# Patient Record
Sex: Female | Born: 1985 | Race: Black or African American | Hispanic: No | Marital: Married | Smoking: Never smoker
Health system: Southern US, Community
[De-identification: ages and names within clinical notes are randomized; demographics above are authoritative.]

## PROBLEM LIST (undated history)

## (undated) ENCOUNTER — Inpatient Hospital Stay (HOSPITAL_COMMUNITY): Payer: Self-pay

## (undated) DIAGNOSIS — Z862 Personal history of diseases of the blood and blood-forming organs and certain disorders involving the immune mechanism: Secondary | ICD-10-CM

## (undated) DIAGNOSIS — T4145XA Adverse effect of unspecified anesthetic, initial encounter: Secondary | ICD-10-CM

## (undated) DIAGNOSIS — T8859XA Other complications of anesthesia, initial encounter: Secondary | ICD-10-CM

## (undated) DIAGNOSIS — R51 Headache: Principal | ICD-10-CM

## (undated) DIAGNOSIS — E559 Vitamin D deficiency, unspecified: Secondary | ICD-10-CM

## (undated) DIAGNOSIS — R519 Headache, unspecified: Secondary | ICD-10-CM

## (undated) DIAGNOSIS — Z789 Other specified health status: Secondary | ICD-10-CM

## (undated) HISTORY — PX: NO PAST SURGERIES: SHX2092

## (undated) HISTORY — DX: Vitamin D deficiency, unspecified: E55.9

## (undated) HISTORY — DX: Personal history of diseases of the blood and blood-forming organs and certain disorders involving the immune mechanism: Z86.2

## (undated) HISTORY — DX: Headache, unspecified: R51.9

## (undated) HISTORY — DX: Headache: R51

---

## 2006-11-11 ENCOUNTER — Emergency Department (HOSPITAL_COMMUNITY): Admission: EM | Admit: 2006-11-11 | Discharge: 2006-11-11 | Payer: Self-pay | Admitting: Emergency Medicine

## 2007-07-05 ENCOUNTER — Inpatient Hospital Stay (HOSPITAL_COMMUNITY): Admission: AD | Admit: 2007-07-05 | Discharge: 2007-07-05 | Payer: Self-pay | Admitting: Obstetrics and Gynecology

## 2007-07-05 ENCOUNTER — Inpatient Hospital Stay (HOSPITAL_COMMUNITY): Admission: AD | Admit: 2007-07-05 | Discharge: 2007-07-08 | Payer: Self-pay | Admitting: Obstetrics & Gynecology

## 2007-07-20 ENCOUNTER — Encounter: Admission: RE | Admit: 2007-07-20 | Discharge: 2007-08-19 | Payer: Self-pay | Admitting: Obstetrics & Gynecology

## 2007-08-20 ENCOUNTER — Encounter: Admission: RE | Admit: 2007-08-20 | Discharge: 2007-09-18 | Payer: Self-pay | Admitting: Obstetrics & Gynecology

## 2007-09-19 ENCOUNTER — Encounter: Admission: RE | Admit: 2007-09-19 | Discharge: 2007-10-19 | Payer: Self-pay | Admitting: Obstetrics & Gynecology

## 2007-10-20 ENCOUNTER — Encounter: Admission: RE | Admit: 2007-10-20 | Discharge: 2007-11-18 | Payer: Self-pay | Admitting: Obstetrics & Gynecology

## 2007-11-19 ENCOUNTER — Encounter: Admission: RE | Admit: 2007-11-19 | Discharge: 2007-12-10 | Payer: Self-pay | Admitting: Obstetrics & Gynecology

## 2011-06-02 LAB — GC/CHLAMYDIA PROBE AMP, GENITAL: Chlamydia: NEGATIVE

## 2011-06-02 LAB — HEPATITIS B SURFACE ANTIGEN: Hepatitis B Surface Ag: NEGATIVE

## 2011-06-02 LAB — HIV ANTIBODY (ROUTINE TESTING W REFLEX): HIV: NONREACTIVE

## 2011-06-02 LAB — ABO/RH: RH Type: POSITIVE

## 2011-06-06 ENCOUNTER — Inpatient Hospital Stay (HOSPITAL_COMMUNITY)
Admission: AD | Admit: 2011-06-06 | Discharge: 2011-06-06 | Disposition: A | Discharge status: Home / Self Care | Payer: Medicaid Other | Source: Ambulatory Visit | Attending: Obstetrics and Gynecology | Admitting: Obstetrics and Gynecology

## 2011-06-06 ENCOUNTER — Encounter (HOSPITAL_COMMUNITY): Payer: Self-pay

## 2011-06-06 DIAGNOSIS — O2390 Unspecified genitourinary tract infection in pregnancy, unspecified trimester: Secondary | ICD-10-CM | POA: Insufficient documentation

## 2011-06-06 DIAGNOSIS — O210 Mild hyperemesis gravidarum: Secondary | ICD-10-CM

## 2011-06-06 DIAGNOSIS — N390 Urinary tract infection, site not specified: Secondary | ICD-10-CM

## 2011-06-06 DIAGNOSIS — O2340 Unspecified infection of urinary tract in pregnancy, unspecified trimester: Secondary | ICD-10-CM

## 2011-06-06 HISTORY — DX: Other specified health status: Z78.9

## 2011-06-06 LAB — COMPREHENSIVE METABOLIC PANEL
AST: 19 U/L (ref 0–37)
Albumin: 3.7 g/dL (ref 3.5–5.2)
Alkaline Phosphatase: 47 U/L (ref 39–117)
BUN: 9 mg/dL (ref 6–23)
CO2: 19 mEq/L (ref 19–32)
Chloride: 100 mEq/L (ref 96–112)
Creatinine, Ser: <0.47 mg/dL — ABNORMAL LOW (ref 0.50–1.10)
Potassium: 4.1 mEq/L (ref 3.5–5.1)
Total Bilirubin: 0.5 mg/dL (ref 0.3–1.2)

## 2011-06-06 LAB — URINALYSIS, ROUTINE W REFLEX MICROSCOPIC
Bilirubin Urine: NEGATIVE
Ketones, ur: 15 mg/dL — AB
Nitrite: NEGATIVE
Protein, ur: NEGATIVE mg/dL
Specific Gravity, Urine: 1.02 (ref 1.005–1.030)
Urobilinogen, UA: 1 mg/dL (ref 0.0–1.0)

## 2011-06-06 LAB — CBC
Hemoglobin: 13 g/dL (ref 12.0–15.0)
MCH: 31.1 pg (ref 26.0–34.0)
MCHC: 35.7 g/dL (ref 30.0–36.0)
MCV: 87.1 fL (ref 78.0–100.0)
RBC: 4.18 MIL/uL (ref 3.87–5.11)

## 2011-06-06 LAB — URINE MICROSCOPIC-ADD ON

## 2011-06-06 MED ORDER — ONDANSETRON 8 MG PO TBDP: 8.0000 mg | ORAL_TABLET | Freq: Three times a day (TID) | ORAL | Status: AC | PRN

## 2011-06-06 MED ORDER — ONDANSETRON HCL 4 MG/2ML IJ SOLN
4.0000 mg | Freq: Once | INTRAMUSCULAR | Status: AC
  Administered 2011-06-06: 4 mg via INTRAVENOUS
  Filled 2011-06-06: qty 2

## 2011-06-06 MED ORDER — NITROFURANTOIN MONOHYD MACRO 100 MG PO CAPS: 100.0000 mg | ORAL_CAPSULE | Freq: Two times a day (BID) | ORAL | Status: AC

## 2011-06-06 MED ORDER — PROMETHAZINE HCL 25 MG/ML IJ SOLN
25.0000 mg | Freq: Once | INTRAVENOUS | Status: AC
  Administered 2011-06-06: 25 mg via INTRAVENOUS
  Filled 2011-06-06: qty 1

## 2011-06-06 NOTE — Progress Notes (Signed)
Patient reports having N/V ever since found out pregnant taking something for nausea however not helping, abdominal discomfort, weakness

## 2011-06-06 NOTE — ED Provider Notes (Signed)
History     Chief Complaint  Patient presents with  . Emesis During Pregnancy  . Fatigue  . Abdominal Pain   HPI Has been seen in the office this pregnancy and has phenergan tablets but "they do not work".  Has not had any Phenergan today.  Has not been able to keep down food or fluids today.  OB History    Grav Para Term Preterm Abortions TAB SAB Ect Mult Living   2 1 1       1       Past Medical History  Diagnosis Date  . No pertinent past medical history     Past Surgical History  Procedure Date  . No past surgeries     No family history on file.  History  Substance Use Topics  . Smoking status: Never Smoker   . Smokeless tobacco: Not on file  . Alcohol Use: No    Allergies: No Known Allergies  Prescriptions prior to admission  Medication Sig Dispense Refill  . ketoconazole (NIZORAL) 200 MG tablet Take 200 mg by mouth daily.        . prenatal vitamin w/FE, FA (PRENATAL 1 + 1) 27-1 MG TABS Take 1 tablet by mouth daily. Patient no longer takes the Prenatal vitamin.  It makes her really sick to her stomach.       . promethazine (PHENERGAN) 25 MG tablet Take 25 mg by mouth every 6 (six) hours as needed.          ROS Physical Exam   Blood pressure 111/60, pulse 56, temperature 98.3 F (36.8 C), temperature source Oral, resp. rate 18, height 5\' 4"  (1.626 m), weight 192 lb 12.8 oz (87.454 kg), last menstrual period 04/06/2011.  Physical Exam  Nursing note and vitals reviewed. Constitutional: She is oriented to person, place, and time. She appears well-developed and well-nourished.  HENT:  Head: Normocephalic.  Eyes: EOM are normal.  Neck: Neck supple.  Musculoskeletal: Normal range of motion.  Neurological: She is alert and oriented to person, place, and time.  Skin: Skin is warm and dry.    MAU Course  Procedures  MDM  Results for orders placed during the hospital encounter of 06/06/11 (from the past 24 hour(s))  URINALYSIS, ROUTINE W REFLEX  MICROSCOPIC     Status: Abnormal   Collection Time   06/06/11  4:10 PM      Component Value Range   Color, Urine YELLOW  YELLOW    Appearance CLOUDY (*) CLEAR    Specific Gravity, Urine 1.020  1.005 - 1.030    pH 8.5 (*) 5.0 - 8.0    Glucose, UA NEGATIVE  NEGATIVE (mg/dL)   Hgb urine dipstick NEGATIVE  NEGATIVE    Bilirubin Urine NEGATIVE  NEGATIVE    Ketones, ur 15 (*) NEGATIVE (mg/dL)   Protein, ur NEGATIVE  NEGATIVE (mg/dL)   Urobilinogen, UA 1.0  0.0 - 1.0 (mg/dL)   Nitrite NEGATIVE  NEGATIVE    Leukocytes, UA LARGE (*) NEGATIVE   URINE MICROSCOPIC-ADD ON     Status: Abnormal   Collection Time   06/06/11  4:10 PM      Component Value Range   Squamous Epithelial / LPF MANY (*) RARE    WBC, UA 21-50  <3 (WBC/hpf)   RBC / HPF 0-2  <3 (RBC/hpf)   Bacteria, UA MANY (*) RARE   POCT PREGNANCY, URINE     Status: Normal   Collection Time   06/06/11  4:14 PM  Component Value Range   Preg Test, Ur POSITIVE     Consult with Dr. Henderson Cloud.  Will give IVF to help control vomiting and will treat for UTI.  Assessment: Hyperemesis Urinary tract infection in pregnancy   Nolene Bernheim, NP 06/06/11 2010

## 2011-09-03 LAB — CBC
MCV: 93.1
Platelets: 151
WBC: 9.8

## 2011-09-06 LAB — CBC: Hemoglobin: 12

## 2011-09-06 LAB — RPR: RPR Ser Ql: NONREACTIVE

## 2011-10-26 ENCOUNTER — Inpatient Hospital Stay (HOSPITAL_COMMUNITY)
Admission: AD | Admit: 2011-10-26 | Discharge: 2011-10-27 | Disposition: A | Discharge status: Home / Self Care | Payer: Medicaid Other | Source: Ambulatory Visit | Attending: Obstetrics & Gynecology | Admitting: Obstetrics & Gynecology

## 2011-10-26 ENCOUNTER — Encounter (HOSPITAL_COMMUNITY): Payer: Self-pay | Admitting: *Deleted

## 2011-10-26 DIAGNOSIS — M62830 Muscle spasm of back: Secondary | ICD-10-CM

## 2011-10-26 DIAGNOSIS — N949 Unspecified condition associated with female genital organs and menstrual cycle: Secondary | ICD-10-CM

## 2011-10-26 DIAGNOSIS — M545 Low back pain, unspecified: Secondary | ICD-10-CM | POA: Insufficient documentation

## 2011-10-26 DIAGNOSIS — O99891 Other specified diseases and conditions complicating pregnancy: Secondary | ICD-10-CM | POA: Insufficient documentation

## 2011-10-26 DIAGNOSIS — M5380 Other specified dorsopathies, site unspecified: Secondary | ICD-10-CM | POA: Insufficient documentation

## 2011-10-26 LAB — URINALYSIS, ROUTINE W REFLEX MICROSCOPIC
Glucose, UA: 250 mg/dL — AB
Hgb urine dipstick: NEGATIVE
Ketones, ur: NEGATIVE mg/dL
Protein, ur: NEGATIVE mg/dL
pH: 7 (ref 5.0–8.0)

## 2011-10-26 MED ORDER — OXYCODONE-ACETAMINOPHEN 5-325 MG PO TABS
1.0000 | ORAL_TABLET | Freq: Once | ORAL | Status: AC
  Administered 2011-10-26: 1 via ORAL
  Filled 2011-10-26: qty 1

## 2011-10-26 MED ORDER — CYCLOBENZAPRINE HCL 10 MG PO TABS
10.0000 mg | ORAL_TABLET | Freq: Once | ORAL | Status: AC
  Administered 2011-10-26: 10 mg via ORAL
  Filled 2011-10-26: qty 1

## 2011-10-26 NOTE — Progress Notes (Signed)
Pt states she started having pain about 1730 when she was eating

## 2011-10-26 NOTE — ED Provider Notes (Signed)
History     Chief Complaint  Patient presents with  . Dysmenorrhea   HPI 25 y.o. G2P1001 at [redacted]w[redacted]d. Pain starting around 5:30 tonight, pain primarily in low back, also having pain in suprapubic area, about q 10 minutes, sharp and quick pain, lasts a few seconds. Worse with fetal movement. No bleeding or discharge, + fetal movement.     Past Medical History  Diagnosis Date  . No pertinent past medical history     Past Surgical History  Procedure Date  . No past surgeries     History reviewed. No pertinent family history.  History  Substance Use Topics  . Smoking status: Never Smoker   . Smokeless tobacco: Not on file  . Alcohol Use: No    Allergies: No Known Allergies  Prescriptions prior to admission  Medication Sig Dispense Refill  . prenatal vitamin w/FE, FA (PRENATAL 1 + 1) 27-1 MG TABS Take 1 tablet by mouth daily.         Review of Systems  Constitutional: Negative.   HENT: Positive for neck pain.   Respiratory: Negative.   Cardiovascular: Negative.   Gastrointestinal: Positive for abdominal pain. Negative for nausea, vomiting, diarrhea and constipation.  Genitourinary: Negative for dysuria, urgency, frequency, hematuria and flank pain.       Negative for vaginal bleeding, cramping/contractions  Neurological: Negative.   Psychiatric/Behavioral: Negative.    Physical Exam   Blood pressure 114/65, pulse 88, temperature 98.1 F (36.7 C), temperature source Oral, resp. rate 20, height 5\' 6"  (1.676 m), weight 98.884 kg (218 lb), last menstrual period 04/06/2011, SpO2 99.00%.  Physical Exam  Nursing note and vitals reviewed. Constitutional: She is oriented to person, place, and time. She appears well-developed and well-nourished. She appears distressed.  Cardiovascular: Normal rate.   Respiratory: Effort normal.  GI: Soft. She exhibits no distension and no mass. There is no tenderness. There is no rebound and no guarding.  Genitourinary:       Spec exam:  cervix visually closed and long  Musculoskeletal: Normal range of motion. She exhibits tenderness (lumbar).  Neurological: She is alert and oriented to person, place, and time.  Skin: Skin is warm and dry.  Psychiatric: She has a normal mood and affect.   EFM: 140, mod variability, + accels, no decels, no contractions MAU Course  Procedures  Results for orders placed during the hospital encounter of 10/26/11 (from the past 24 hour(s))  URINALYSIS, ROUTINE W REFLEX MICROSCOPIC     Status: Abnormal   Collection Time   10/26/11  9:50 PM      Component Value Range   Color, Urine YELLOW  YELLOW    APPearance CLEAR  CLEAR    Specific Gravity, Urine 1.010  1.005 - 1.030    pH 7.0  5.0 - 8.0    Glucose, UA 250 (*) NEGATIVE (mg/dL)   Hgb urine dipstick NEGATIVE  NEGATIVE    Bilirubin Urine NEGATIVE  NEGATIVE    Ketones, ur NEGATIVE  NEGATIVE (mg/dL)   Protein, ur NEGATIVE  NEGATIVE (mg/dL)   Urobilinogen, UA 0.2  0.0 - 1.0 (mg/dL)   Nitrite NEGATIVE  NEGATIVE    Leukocytes, UA TRACE (*) NEGATIVE   URINE MICROSCOPIC-ADD ON     Status: Normal   Collection Time   10/26/11  9:50 PM      Component Value Range   Squamous Epithelial / LPF RARE  RARE    WBC, UA 0-2  <3 (WBC/hpf)   Bacteria, UA RARE  RARE    Pain seems c/w muscle spasm - Percocet and Flexeril given, pain improved  Assessment and Plan  25 y.o. G2P1001 at [redacted]w[redacted]d Muscle spasm - rx flexeril Precautions rev'd, f/u as scheduled  Roberta Galvan,Roberta Galvan 10/26/2011, 10:17 PM

## 2011-10-26 NOTE — Progress Notes (Signed)
Pt states she went shopping today-after she got home;around 1730-she started cramping-states it has gotten worse and worse

## 2011-10-27 MED ORDER — CYCLOBENZAPRINE HCL 10 MG PO TABS: 10.0000 mg | ORAL_TABLET | Freq: Three times a day (TID) | ORAL | Status: AC | PRN

## 2011-11-23 NOTE — L&D Delivery Note (Signed)
Good progression with a NSD of viable 7 pound 7.8 oz Female, Apgars 8/9 over intact perineum.  PDI.  Appeared to have a short cord.  2A/1V.  No Tears.  Breast and bottle feed.

## 2011-12-17 LAB — STREP B DNA PROBE: GBS: POSITIVE

## 2011-12-30 ENCOUNTER — Inpatient Hospital Stay (HOSPITAL_COMMUNITY)
Admission: AD | Admit: 2011-12-30 | Discharge: 2012-01-02 | DRG: 775 | Disposition: A | Discharge status: Home / Self Care | Payer: Medicaid Other | Source: Ambulatory Visit | Attending: Obstetrics and Gynecology | Admitting: Obstetrics and Gynecology

## 2011-12-30 DIAGNOSIS — Z22330 Carrier of Group B streptococcus: Secondary | ICD-10-CM

## 2011-12-30 DIAGNOSIS — Z3480 Encounter for supervision of other normal pregnancy, unspecified trimester: Secondary | ICD-10-CM

## 2011-12-30 DIAGNOSIS — O99892 Other specified diseases and conditions complicating childbirth: Principal | ICD-10-CM | POA: Diagnosis present

## 2011-12-30 NOTE — Progress Notes (Signed)
Pt presents for contractions, seen in MD's office this am and was 2 cm. Denies problems with pregnancy. Reports good fetal movement

## 2011-12-31 ENCOUNTER — Encounter (HOSPITAL_COMMUNITY): Payer: Self-pay | Admitting: Anesthesiology

## 2011-12-31 ENCOUNTER — Inpatient Hospital Stay (HOSPITAL_COMMUNITY): Payer: Medicaid Other | Admitting: Anesthesiology

## 2011-12-31 LAB — CBC
HCT: 33 % — ABNORMAL LOW (ref 36.0–46.0)
HCT: 33.2 % — ABNORMAL LOW (ref 36.0–46.0)
Hemoglobin: 11 g/dL — ABNORMAL LOW (ref 12.0–15.0)
Hemoglobin: 10.7 g/dL — ABNORMAL LOW (ref 12.0–15.0)
MCH: 30 pg (ref 26.0–34.0)
MCH: 29.7 pg (ref 26.0–34.0)
MCHC: 33.3 g/dL (ref 30.0–36.0)
MCHC: 32.5 g/dL (ref 30.0–36.0)
MCV: 93 fL (ref 78.0–100.0)
MCV: 91.5 fL (ref 78.0–100.0)
Platelets: 114 10*3/uL — ABNORMAL LOW (ref 150–400)
RDW: 14.4 % (ref 11.5–15.5)
RDW: 14.2 % (ref 11.5–15.5)
RDW: 14.2 % (ref 11.5–15.5)
WBC: 6.3 10*3/uL (ref 4.0–10.5)

## 2011-12-31 MED ORDER — ZOLPIDEM TARTRATE 5 MG PO TABS: 5.0000 mg | ORAL_TABLET | Freq: Every evening | ORAL | Status: DC | PRN

## 2011-12-31 MED ORDER — ONDANSETRON HCL 4 MG PO TABS: 4.0000 mg | ORAL_TABLET | ORAL | Status: DC | PRN

## 2011-12-31 MED ORDER — LIDOCAINE HCL 1.5 % IJ SOLN
INTRAMUSCULAR | Status: DC | PRN
  Administered 2011-12-31 (×2): 5 mL via EPIDURAL

## 2011-12-31 MED ORDER — LACTATED RINGERS IV SOLN
INTRAVENOUS | Status: DC
  Administered 2011-12-31 (×2): via INTRAVENOUS

## 2011-12-31 MED ORDER — PENICILLIN G POTASSIUM 5000000 UNITS IJ SOLR
5.0000 10*6.[IU] | Freq: Once | INTRAVENOUS | Status: AC
  Administered 2011-12-31: 5 10*6.[IU] via INTRAVENOUS
  Filled 2011-12-31: qty 5

## 2011-12-31 MED ORDER — PHENYLEPHRINE 40 MCG/ML (10ML) SYRINGE FOR IV PUSH (FOR BLOOD PRESSURE SUPPORT): 80.0000 ug | PREFILLED_SYRINGE | INTRAVENOUS | Status: DC | PRN

## 2011-12-31 MED ORDER — DIPHENHYDRAMINE HCL 25 MG PO CAPS: 25.0000 mg | ORAL_CAPSULE | Freq: Four times a day (QID) | ORAL | Status: DC | PRN

## 2011-12-31 MED ORDER — IBUPROFEN 600 MG PO TABS: 600.0000 mg | ORAL_TABLET | Freq: Four times a day (QID) | ORAL | Status: DC | PRN

## 2011-12-31 MED ORDER — SENNOSIDES-DOCUSATE SODIUM 8.6-50 MG PO TABS
2.0000 | ORAL_TABLET | Freq: Every day | ORAL | Status: DC
  Administered 2011-12-31 – 2012-01-01 (×2): 2 via ORAL

## 2011-12-31 MED ORDER — PENICILLIN G POTASSIUM 5000000 UNITS IJ SOLR
2.5000 10*6.[IU] | INTRAVENOUS | Status: DC
  Administered 2011-12-31 (×3): 2.5 10*6.[IU] via INTRAVENOUS
  Filled 2011-12-31 (×7): qty 2.5

## 2011-12-31 MED ORDER — ACETAMINOPHEN 325 MG PO TABS: 650.0000 mg | ORAL_TABLET | ORAL | Status: DC | PRN

## 2011-12-31 MED ORDER — ONDANSETRON HCL 4 MG/2ML IJ SOLN
4.0000 mg | Freq: Four times a day (QID) | INTRAMUSCULAR | Status: DC | PRN
  Administered 2011-12-31: 4 mg via INTRAVENOUS
  Filled 2011-12-31: qty 2

## 2011-12-31 MED ORDER — LANOLIN HYDROUS EX OINT: TOPICAL_OINTMENT | CUTANEOUS | Status: DC | PRN

## 2011-12-31 MED ORDER — BUTORPHANOL TARTRATE 2 MG/ML IJ SOLN
1.0000 mg | Freq: Once | INTRAMUSCULAR | Status: AC
  Administered 2011-12-31: 1 mg via INTRAVENOUS
  Filled 2011-12-31: qty 1

## 2011-12-31 MED ORDER — DIPHENHYDRAMINE HCL 50 MG/ML IJ SOLN: 12.5000 mg | INTRAMUSCULAR | Status: DC | PRN

## 2011-12-31 MED ORDER — SODIUM CHLORIDE 0.9 % IV SOLN: 250.0000 mL | INTRAVENOUS | Status: DC | PRN

## 2011-12-31 MED ORDER — OXYCODONE-ACETAMINOPHEN 5-325 MG PO TABS: 1.0000 | ORAL_TABLET | ORAL | Status: DC | PRN

## 2011-12-31 MED ORDER — FENTANYL 2.5 MCG/ML BUPIVACAINE 1/10 % EPIDURAL INFUSION (WH - ANES)
14.0000 mL/h | INTRAMUSCULAR | Status: DC
  Administered 2011-12-31 (×2): 14 mL/h via EPIDURAL
  Filled 2011-12-31 (×2): qty 60

## 2011-12-31 MED ORDER — TERBUTALINE SULFATE 1 MG/ML IJ SOLN: 0.2500 mg | Freq: Once | INTRAMUSCULAR | Status: DC | PRN

## 2011-12-31 MED ORDER — CITRIC ACID-SODIUM CITRATE 334-500 MG/5ML PO SOLN: 30.0000 mL | ORAL | Status: DC | PRN

## 2011-12-31 MED ORDER — ONDANSETRON HCL 4 MG/2ML IJ SOLN: 4.0000 mg | INTRAMUSCULAR | Status: DC | PRN

## 2011-12-31 MED ORDER — LACTATED RINGERS IV SOLN: 500.0000 mL | Freq: Once | INTRAVENOUS | Status: DC

## 2011-12-31 MED ORDER — SODIUM CHLORIDE 0.9 % IJ SOLN: 3.0000 mL | Freq: Two times a day (BID) | INTRAMUSCULAR | Status: DC

## 2011-12-31 MED ORDER — WITCH HAZEL-GLYCERIN EX PADS: 1.0000 "application " | MEDICATED_PAD | CUTANEOUS | Status: DC | PRN

## 2011-12-31 MED ORDER — TETANUS-DIPHTH-ACELL PERTUSSIS 5-2.5-18.5 LF-MCG/0.5 IM SUSP: 0.5000 mL | Freq: Once | INTRAMUSCULAR | Status: DC

## 2011-12-31 MED ORDER — PHENYLEPHRINE 40 MCG/ML (10ML) SYRINGE FOR IV PUSH (FOR BLOOD PRESSURE SUPPORT)
80.0000 ug | PREFILLED_SYRINGE | INTRAVENOUS | Status: DC | PRN
  Filled 2011-12-31: qty 5

## 2011-12-31 MED ORDER — IBUPROFEN 600 MG PO TABS
600.0000 mg | ORAL_TABLET | Freq: Four times a day (QID) | ORAL | Status: DC
  Administered 2011-12-31 – 2012-01-02 (×7): 600 mg via ORAL
  Filled 2011-12-31 (×7): qty 1

## 2011-12-31 MED ORDER — OXYTOCIN 20 UNITS IN LACTATED RINGERS INFUSION - SIMPLE
1.0000 m[IU]/min | INTRAVENOUS | Status: DC
  Administered 2011-12-31: 333 m[IU]/min via INTRAVENOUS
  Administered 2011-12-31: 2 m[IU]/min via INTRAVENOUS
  Filled 2011-12-31: qty 1000

## 2011-12-31 MED ORDER — OXYTOCIN BOLUS FROM INFUSION
500.0000 mL | Freq: Once | INTRAVENOUS | Status: DC
  Filled 2011-12-31: qty 500

## 2011-12-31 MED ORDER — EPHEDRINE 5 MG/ML INJ: 10.0000 mg | INTRAVENOUS | Status: DC | PRN

## 2011-12-31 MED ORDER — BENZOCAINE-MENTHOL 20-0.5 % EX AERO: 1.0000 "application " | INHALATION_SPRAY | CUTANEOUS | Status: DC | PRN

## 2011-12-31 MED ORDER — OXYCODONE-ACETAMINOPHEN 5-325 MG PO TABS
1.0000 | ORAL_TABLET | ORAL | Status: DC | PRN
Start: 2011-12-31 — End: 2012-01-02
  Administered 2011-12-31 – 2012-01-01 (×2): 1 via ORAL
  Filled 2011-12-31 (×2): qty 1

## 2011-12-31 MED ORDER — SODIUM CHLORIDE 0.9 % IJ SOLN: 3.0000 mL | INTRAMUSCULAR | Status: DC | PRN

## 2011-12-31 MED ORDER — EPHEDRINE 5 MG/ML INJ
10.0000 mg | INTRAVENOUS | Status: DC | PRN
  Filled 2011-12-31: qty 4

## 2011-12-31 MED ORDER — LIDOCAINE HCL (PF) 1 % IJ SOLN
30.0000 mL | INTRAMUSCULAR | Status: DC | PRN
  Filled 2011-12-31: qty 30

## 2011-12-31 MED ORDER — SIMETHICONE 80 MG PO CHEW: 80.0000 mg | CHEWABLE_TABLET | ORAL | Status: DC | PRN

## 2011-12-31 MED ORDER — OXYTOCIN 20 UNITS IN LACTATED RINGERS INFUSION - SIMPLE: 125.0000 mL/h | Freq: Once | INTRAVENOUS | Status: DC

## 2011-12-31 MED ORDER — PRENATAL MULTIVITAMIN CH
1.0000 | ORAL_TABLET | Freq: Every day | ORAL | Status: DC
  Administered 2012-01-01 – 2012-01-02 (×2): 1 via ORAL
  Filled 2011-12-31 (×2): qty 1

## 2011-12-31 MED ORDER — LACTATED RINGERS IV SOLN
500.0000 mL | INTRAVENOUS | Status: DC | PRN
  Administered 2011-12-31: 300 mL via INTRAVENOUS

## 2011-12-31 MED ORDER — BENZOCAINE-MENTHOL 20-0.5 % EX AERO
INHALATION_SPRAY | CUTANEOUS | Status: AC
  Filled 2011-12-31: qty 56

## 2011-12-31 MED ORDER — FLEET ENEMA 7-19 GM/118ML RE ENEM: 1.0000 | ENEMA | RECTAL | Status: DC | PRN

## 2011-12-31 MED ORDER — DIBUCAINE 1 % RE OINT: 1.0000 "application " | TOPICAL_OINTMENT | RECTAL | Status: DC | PRN

## 2011-12-31 NOTE — Anesthesia Preprocedure Evaluation (Signed)
Anesthesia Evaluation  Patient identified by MRN, date of birth, ID band Patient awake    Reviewed: Allergy & Precautions, H&P , Patient's Chart, lab work & pertinent test results  Airway Mallampati: III TM Distance: >3 FB Neck ROM: full    Dental No notable dental hx.    Pulmonary neg pulmonary ROS,  clear to auscultation  Pulmonary exam normal       Cardiovascular neg cardio ROS regular Normal    Neuro/Psych Negative Neurological ROS  Negative Psych ROS   GI/Hepatic negative GI ROS, Neg liver ROS,   Endo/Other  Negative Endocrine ROSMorbid obesity  Renal/GU negative Renal ROS     Musculoskeletal   Abdominal   Peds  Hematology negative hematology ROS (+)   Anesthesia Other Findings thrombocytopenia  Reproductive/Obstetrics (+) Pregnancy                           Anesthesia Physical Anesthesia Plan  ASA: III  Anesthesia Plan: Epidural   Post-op Pain Management:    Induction:   Airway Management Planned:   Additional Equipment:   Intra-op Plan:   Post-operative Plan:   Informed Consent: I have reviewed the patients History and Physical, chart, labs and discussed the procedure including the risks, benefits and alternatives for the proposed anesthesia with the patient or authorized representative who has indicated his/her understanding and acceptance.     Plan Discussed with:   Anesthesia Plan Comments:         Anesthesia Quick Evaluation

## 2011-12-31 NOTE — Progress Notes (Signed)
FH  Reassuring.  S + 37/ Vtx Cx to me 2-3/60/vtx-2-3 Previous AROM earlier On Pitocin and Pcn for + GBS

## 2011-12-31 NOTE — Progress Notes (Signed)
Pt dizzy and nauseated after medication, instructed to call out if she needs to get up to restroom

## 2011-12-31 NOTE — Anesthesia Procedure Notes (Signed)
Epidural Patient location during procedure: OB Start time: 12/31/2011 10:06 AM  Staffing Anesthesiologist: Brayton Caves R Performed by: anesthesiologist   Preanesthetic Checklist Completed: patient identified, site marked, surgical consent, pre-op evaluation, timeout performed, IV checked, risks and benefits discussed and monitors and equipment checked  Epidural Patient position: sitting Prep: site prepped and draped and DuraPrep Patient monitoring: continuous pulse ox and blood pressure Approach: midline Injection technique: LOR air and LOR saline  Needle:  Needle type: Tuohy  Needle gauge: 17 G Needle length: 9 cm Needle insertion depth: 6 cm Catheter type: closed end flexible Catheter size: 19 Gauge Catheter at skin depth: 11 cm Test dose: negative  Assessment Events: blood not aspirated, injection not painful, no injection resistance, negative IV test and no paresthesia  Additional Notes Patient identified.  Risk benefits discussed including failed block, incomplete pain control, headache, nerve damage, paralysis, blood pressure changes, nausea, vomiting, reactions to medication both toxic or allergic, and postpartum back pain.  Patient expressed understanding and wished to proceed.  All questions were answered.  Sterile technique used throughout procedure and epidural site dressed with sterile barrier dressing. No paresthesia or other complications noted.The patient did not experience any signs of intravascular injection such as tinnitus or metallic taste in mouth nor signs of intrathecal spread such as rapid motor block. Please see nursing notes for vital signs.

## 2011-12-31 NOTE — H&P (Signed)
Pt is a 26year old black female, G1P0 at term who presents to the hospital with labor. PNC was uncomplicated. +GBS. PMHx: see [facility]  WU:JWJXB HEENT- wnl ABD- gravid, palp contractions Cx- 50/3/-2 vtx FHTs-reactive  IMP/ IUP at term         +GBS  Plan/ Admit

## 2012-01-01 ENCOUNTER — Encounter (HOSPITAL_COMMUNITY): Payer: Self-pay | Admitting: *Deleted

## 2012-01-01 LAB — CBC
HCT: 32 % — ABNORMAL LOW (ref 36.0–46.0)
Hemoglobin: 10.4 g/dL — ABNORMAL LOW (ref 12.0–15.0)
MCH: 30.1 pg (ref 26.0–34.0)
MCHC: 32.5 g/dL (ref 30.0–36.0)
MCV: 92.5 fL (ref 78.0–100.0)
Platelets: 108 10*3/uL — ABNORMAL LOW (ref 150–400)
RBC: 3.46 MIL/uL — ABNORMAL LOW (ref 3.87–5.11)
RDW: 14.4 % (ref 11.5–15.5)
WBC: 6.9 10*3/uL (ref 4.0–10.5)

## 2012-01-01 NOTE — Anesthesia Postprocedure Evaluation (Signed)
  Anesthesia Post-op Note  Patient: Roberta Galvan  Procedure(s) Performed: * No procedures listed *  Patient Location: Mother/Baby  Anesthesia Type: Epidural  Level of Consciousness: awake, alert  and oriented  Airway and Oxygen Therapy: Patient Spontanous Breathing  Post-op Pain: mild  Post-op Assessment: Patient's Cardiovascular Status Stable, Respiratory Function Stable, Patent Airway, No signs of Nausea or vomiting and Pain level controlled  Post-op Vital Signs: stable  Complications: No apparent anesthesia complications

## 2012-01-01 NOTE — Progress Notes (Signed)
Both Mother and Pecola Leisure doing well.  Breast feeding.  CBC: 10.4/6.1  With 108K platelets.

## 2012-01-02 NOTE — Discharge Summary (Signed)
Discharge diagnoses-  #1-38 week and 3 day intrauterine pregnancy delivered 7 pounds 7.8 ounce female infant Apgars 8 and 9  #2-blood type A-positive  Procedures-normal spontaneous delivery  Summary-this 27 year old gravida 2 now para 2 presented in active labor. S and subsequently had a normal spontaneous delivery of a 7 pound 7.8 ounce female infant with Apgars of 8 and 9 over an intact perineum. There was a short umbilical cord noted after delivery of the placenta.  The mother's postpartum course was totally benign. A CBC on 2/9 was 10.4/6.9 with 108,000 platelets. She was breast and bottle feeding postpartum. On the morning of 2/10 she was anxious for discharge and accordingly was given all appropriate instructions per discharge brochure and understood all instructions well. Discharge medications include vitamins-1 a day as long as she is breast-feeding and she will use equivalent Feosol capsules 1 every other day. She was also given prescriptions for Motrin 600 mg to use every 6 hours as needed for cramping or mild pain and Percocet 5/325 to use 1-2 every 4-6 hours for more severe pain.  She will return to the office for followup in approximately 4 weeks' time. Condition on discharge improved.

## 2012-01-03 NOTE — Progress Notes (Signed)
Post discharge chart review completed.  

## 2013-05-16 ENCOUNTER — Emergency Department (INDEPENDENT_AMBULATORY_CARE_PROVIDER_SITE_OTHER)
Admission: EM | Admit: 2013-05-16 | Discharge: 2013-05-16 | Disposition: A | Discharge status: Home / Self Care | Payer: Self-pay | Source: Home / Self Care | Attending: Emergency Medicine | Admitting: Emergency Medicine

## 2013-05-16 ENCOUNTER — Encounter (HOSPITAL_COMMUNITY): Payer: Self-pay

## 2013-05-16 DIAGNOSIS — R0602 Shortness of breath: Secondary | ICD-10-CM

## 2013-05-16 DIAGNOSIS — Z3201 Encounter for pregnancy test, result positive: Secondary | ICD-10-CM

## 2013-05-16 DIAGNOSIS — Z3490 Encounter for supervision of normal pregnancy, unspecified, unspecified trimester: Secondary | ICD-10-CM

## 2013-05-16 LAB — POCT I-STAT, CHEM 8
BUN: 9 mg/dL (ref 6–23)
Calcium, Ion: 1.13 mmol/L (ref 1.12–1.23)
TCO2: 19 mmol/L (ref 0–100)

## 2013-05-16 LAB — POCT URINALYSIS DIP (DEVICE)
Protein, ur: NEGATIVE mg/dL
Specific Gravity, Urine: 1.015 (ref 1.005–1.030)
Urobilinogen, UA: 0.2 mg/dL (ref 0.0–1.0)

## 2013-05-16 LAB — POCT PREGNANCY, URINE: Preg Test, Ur: POSITIVE — AB

## 2013-05-16 NOTE — ED Notes (Signed)
Rr: 11; pulse ox 100 %, pulse 75

## 2013-05-16 NOTE — ED Notes (Signed)
States she had taken a home pregnancy test 3-4 weeks ago which was positive. Since then, she has been nauseated, vomiting, dizzy. Has occasional SOB w exertion. States she did not have this problem w her last baby. Has not seen MD for her pregnancy as yet. NAD

## 2013-05-16 NOTE — ED Provider Notes (Signed)
History    CSN: [id number] Arrival date & time 05/16/13  1017  First MD Initiated Contact with Patient 05/16/13 1037     Chief Complaint  Patient presents with  . Fatigue   (Consider location/radiation/quality/duration/timing/severity/associated sxs/prior Treatment) HPI Comments: Patient presents to urgent care describing that she's been feeling tired fatigued and short of breath at times. Also with occasional dizziness. She describes that she has been testing herself at home and had had a positive pregnancy test. She denies any pelvic pain or vaginal bleeding. She has been now she 8 and had vomited a couple occasions. She has not had any interaction with her obstetrician. This is her third pregnancy.  Patient denies any cough, fevers upper congestion, chest pain or lower extremity swelling.  Patient is a 27 y.o. female presenting with shortness of breath.  Shortness of Breath Severity:  Moderate Onset quality:  Gradual Timing:  Intermittent Context: not activity   Relieved by:  Nothing Worsened by:  Nothing tried Associated symptoms: no cough, no diaphoresis, no fever, no headaches, no rash, no syncope, no vomiting and no wheezing    Past Medical History  Diagnosis Date  . No pertinent past medical history    Past Surgical History  Procedure Laterality Date  . No past surgeries     History reviewed. No pertinent family history. History  Substance Use Topics  . Smoking status: Never Smoker   . Smokeless tobacco: Not on file  . Alcohol Use: No   OB History   Grav Para Term Preterm Abortions TAB SAB Ect Mult Living   2 2 2       2      Review of Systems  Constitutional: Positive for activity change and appetite change. Negative for fever, chills, diaphoresis and unexpected weight change.  Respiratory: Positive for shortness of breath. Negative for apnea, cough, choking, chest tightness and wheezing.   Cardiovascular: Negative for syncope.  Gastrointestinal: Negative  for vomiting.  Genitourinary: Negative for dysuria, vaginal bleeding and pelvic pain.  Musculoskeletal: Negative.   Skin: Negative for color change, pallor and rash.  Neurological: Negative for headaches.    Allergies  Review of patient's allergies indicates no known allergies.  Home Medications   Current Outpatient Rx  Name  Route  Sig  Dispense  Refill  . Prenatal Vit-Fe Fumarate-FA (PRENATAL MULTIVITAMIN) TABS   Oral   Take 1 tablet by mouth daily.          BP 92/75  Pulse 78  Temp(Src) 98.2 F (36.8 C) (Oral)  Resp 16  SpO2 100%  LMP 02/23/2013 Physical Exam  Nursing note and vitals reviewed. Constitutional: Vital signs are normal. She appears well-developed and well-nourished.  Non-toxic appearance. She does not have a sickly appearance. She does not appear ill. No distress.  Eyes: Conjunctivae are normal. No scleral icterus.  Neck: Neck supple.  Cardiovascular: Normal rate, regular rhythm, normal heart sounds and normal pulses.  Exam reveals no gallop and no friction rub.   No murmur heard. Pulmonary/Chest: Effort normal and breath sounds normal. She has no decreased breath sounds. She has no wheezes. She has no rhonchi. She has no rales.  Abdominal: Soft. There is no tenderness.  Neurological: She is alert.  Skin: No erythema.    ED Course  Procedures (including critical care time) Labs Reviewed  POCT URINALYSIS DIP (DEVICE) - Abnormal; Notable for the following:    pH 8.5 (*)    Leukocytes, UA SMALL (*)    All other  components within normal limits  POCT PREGNANCY, URINE - Abnormal; Notable for the following:    Preg Test, Ur POSITIVE (*)    All other components within normal limits  POCT I-STAT, CHEM 8    MDM. Patient with vague symptomatology. Pregnant ( no pelvic pain). Afebrile with a normal respiratory exam. Electrolytes within normal patient will be started on prenatal vitamins. Have been instructed to call her obstetrician to establish prenatal care  and advice and instructed about symptoms that should warrant further evaluation. Patient is breathing comfortably in no respiratory distress oxygenating 100% on room air, and no desaturation with ambulation tests.  Jimmie Molly, MD 05/16/13 [phone]

## 2013-06-17 ENCOUNTER — Encounter (HOSPITAL_COMMUNITY): Payer: Self-pay | Admitting: *Deleted

## 2013-06-17 ENCOUNTER — Inpatient Hospital Stay (HOSPITAL_COMMUNITY)
Admission: AD | Admit: 2013-06-17 | Discharge: 2013-06-18 | Disposition: A | Discharge status: Home / Self Care | Payer: Medicaid Other | Source: Ambulatory Visit | Attending: Obstetrics and Gynecology | Admitting: Obstetrics and Gynecology

## 2013-06-17 ENCOUNTER — Inpatient Hospital Stay (HOSPITAL_COMMUNITY): Payer: Medicaid Other

## 2013-06-17 DIAGNOSIS — M545 Low back pain, unspecified: Secondary | ICD-10-CM | POA: Insufficient documentation

## 2013-06-17 DIAGNOSIS — M5430 Sciatica, unspecified side: Secondary | ICD-10-CM | POA: Insufficient documentation

## 2013-06-17 DIAGNOSIS — O0930 Supervision of pregnancy with insufficient antenatal care, unspecified trimester: Secondary | ICD-10-CM | POA: Insufficient documentation

## 2013-06-17 DIAGNOSIS — M79609 Pain in unspecified limb: Secondary | ICD-10-CM | POA: Insufficient documentation

## 2013-06-17 DIAGNOSIS — O99891 Other specified diseases and conditions complicating pregnancy: Secondary | ICD-10-CM | POA: Insufficient documentation

## 2013-06-17 DIAGNOSIS — N949 Unspecified condition associated with female genital organs and menstrual cycle: Secondary | ICD-10-CM

## 2013-06-17 DIAGNOSIS — M5432 Sciatica, left side: Secondary | ICD-10-CM

## 2013-06-17 LAB — URINALYSIS, ROUTINE W REFLEX MICROSCOPIC
Bilirubin Urine: NEGATIVE
Glucose, UA: NEGATIVE mg/dL
Hgb urine dipstick: NEGATIVE
Ketones, ur: NEGATIVE mg/dL
Protein, ur: NEGATIVE mg/dL

## 2013-06-17 LAB — CBC
MCH: 30.5 pg (ref 26.0–34.0)
MCHC: 35 g/dL (ref 30.0–36.0)
MCV: 87.2 fL (ref 78.0–100.0)
Platelets: 142 10*3/uL — ABNORMAL LOW (ref 150–400)
RDW: 13.7 % (ref 11.5–15.5)

## 2013-06-17 LAB — URINE MICROSCOPIC-ADD ON

## 2013-06-17 LAB — WET PREP, GENITAL

## 2013-06-17 MED ORDER — OXYCODONE-ACETAMINOPHEN 5-325 MG PO TABS
1.0000 | ORAL_TABLET | Freq: Once | ORAL | Status: AC
  Administered 2013-06-17: 1 via ORAL
  Filled 2013-06-17: qty 1

## 2013-06-17 NOTE — MAU Provider Note (Signed)
History     CSN: [id number]  Arrival date and time: 06/17/13 2032   First Provider Initiated Contact with Patient 06/17/13 2241      Chief Complaint  Patient presents with  . Back Pain  . Leg Pain   Back Pain Associated symptoms include leg pain.  Leg Pain     Roberta Galvan is a 27 y.o. G3P2002 at Unknown who presents today with lower back pain starting from the mid-lower back on the left and going down the leg. She states that sometimes after working the pain is worse. She is also feels cramping with the pain too. She states she would like a note to only work 4 hours shifts because working 12 hours shifts makes the pain worse. She denies any vaginal bleeding.   Past Medical History  Diagnosis Date  . No pertinent past medical history     Past Surgical History  Procedure Laterality Date  . No past surgeries      History reviewed. No pertinent family history.  History  Substance Use Topics  . Smoking status: Never Smoker   . Smokeless tobacco: Not on file  . Alcohol Use: No    Allergies: No Known Allergies  Prescriptions prior to admission  Medication Sig Dispense Refill  . ferrous sulfate 325 (65 FE) MG tablet Take 325 mg by mouth daily.      . Prenatal Vit-Fe Fumarate-FA (PRENATAL MULTIVITAMIN) TABS Take 1 tablet by mouth daily.        Review of Systems  Musculoskeletal: Positive for back pain.   Physical Exam   Blood pressure 104/56, pulse 78, temperature 97.9 F (36.6 C), temperature source Oral, resp. rate 18, height 5\' 6"  (1.676 m), weight 86.183 kg (190 lb), last menstrual period 02/23/2013.  Physical Exam  Nursing note and vitals reviewed. Constitutional: She appears well-developed and well-nourished. No distress.  Cardiovascular: Normal rate.   GI: Soft. There is no tenderness.  Genitourinary:  External: no lesion Vagina: small amount of white discharge Cervix: pink, smooth, no CMT Uterus: NSSC Adnexa: NT NO CVA tenderness     MAU  Course  Procedures  Results for orders placed during the hospital encounter of 06/17/13 (from the past 24 hour(s))  URINALYSIS, ROUTINE W REFLEX MICROSCOPIC     Status: Abnormal   Collection Time    06/17/13  9:20 PM      Result Value Range   Color, Urine YELLOW  YELLOW   APPearance CLEAR  CLEAR   Specific Gravity, Urine 1.020  1.005 - 1.030   pH 6.0  5.0 - 8.0   Glucose, UA NEGATIVE  NEGATIVE mg/dL   Hgb urine dipstick NEGATIVE  NEGATIVE   Bilirubin Urine NEGATIVE  NEGATIVE   Ketones, ur NEGATIVE  NEGATIVE mg/dL   Protein, ur NEGATIVE  NEGATIVE mg/dL   Urobilinogen, UA 0.2  0.0 - 1.0 mg/dL   Nitrite NEGATIVE  NEGATIVE   Leukocytes, UA SMALL (*) NEGATIVE  URINE MICROSCOPIC-ADD ON     Status: Abnormal   Collection Time    06/17/13  9:20 PM      Result Value Range   Squamous Epithelial / LPF FEW (*) RARE   WBC, UA 3-6  <3 WBC/hpf   RBC / HPF 0-2  <3 RBC/hpf   Bacteria, UA FEW (*) RARE  CBC     Status: Abnormal   Collection Time    06/17/13 10:25 PM      Result Value Range   WBC 5.8  4.0 -  10.5 K/uL   RBC 4.06  3.87 - 5.11 MIL/uL   Hemoglobin 12.4  12.0 - 15.0 g/dL   HCT 10.2 (*) 72.5 - 36.6 %   MCV 87.2  78.0 - 100.0 fL   MCH 30.5  26.0 - 34.0 pg   MCHC 35.0  30.0 - 36.0 g/dL   RDW 44.0  34.7 - 42.5 %   Platelets 142 (*) 150 - 400 K/uL  ABO/RH     Status: None   Collection Time    06/17/13 10:25 PM      Result Value Range   ABO/RH(D) A POS    HCG, QUANTITATIVE, PREGNANCY     Status: Abnormal   Collection Time    06/17/13 10:25 PM      Result Value Range   hCG, Beta Chain, Quant, [place] [address] (*) <5 mIU/mL  WET PREP, GENITAL     Status: Abnormal   Collection Time    06/17/13 10:50 PM      Result Value Range   Yeast Wet Prep HPF POC NONE SEEN  NONE SEEN   Trich, Wet Prep NONE SEEN  NONE SEEN   Clue Cells Wet Prep HPF POC NONE SEEN  NONE SEEN   WBC, Wet Prep HPF POC FEW (*) NONE SEEN   US Ob Comp Less 14 Wks  06/18/2013   *RADIOLOGY REPORT*  Clinical Data: Pain.   Pregnancy.  OBSTETRIC <14 WK ULTRASOUND  Technique:  Transabdominal ultrasound was performed for evaluation of the gestation as well as the maternal uterus and adnexal regions.  Comparison:  None.  Intrauterine gestational sac: Visualized/normal in shape. Yolk sac: Present. Embryo: Present. Cardiac Activity: Present. Heart Rate: 153 bpm  CRL:  66 mm  13 w  0 d        [place] EDC: 12/23/2013  Maternal uterus/Adnexae: There is no subchorionic hemorrhage.  Both ovaries appear within normal limits.  Right corpus luteum cyst.  No free fluid.  IMPRESSION: Single viable intrauterine pregnancy.  No complicating features.   Original Report Authenticated By: Andreas Newport, M.D.     Assessment and Plan   1. Sciatica, left   2. Pelvic pain complicating pregnancy, antepartum, second trimester    Start [facility] as soon as possible Comfort measures reviewed 2nd trimester precautions given  Tawnya Crook 06/17/2013, 10:54 PM

## 2013-06-17 NOTE — MAU Note (Signed)
Pt LMP in May, +UPT in June at Urgent Care.  No prenatal care.  Pt having pain that starts in the lower back area and moves down the right leg x 2 weeks, became worse today.

## 2013-06-18 DIAGNOSIS — M5430 Sciatica, unspecified side: Secondary | ICD-10-CM

## 2013-06-19 LAB — GC/CHLAMYDIA PROBE AMP
CT Probe RNA: NEGATIVE
GC Probe RNA: NEGATIVE

## 2013-06-19 LAB — URINE CULTURE: Colony Count: 60000

## 2013-06-19 NOTE — MAU Provider Note (Signed)
Attestation of Attending Supervision of Advanced Practitioner: Evaluation and management procedures were performed by the PA/NP/CNM/OB Fellow under my supervision/collaboration. Chart reviewed and agree with management and plan.  Shaniyah Wix V 06/19/2013 8:09 PM   

## 2013-08-08 ENCOUNTER — Other Ambulatory Visit: Payer: Self-pay | Admitting: Obstetrics and Gynecology

## 2013-08-08 LAB — OB RESULTS CONSOLE RUBELLA ANTIBODY, IGM: Rubella: IMMUNE

## 2013-08-08 LAB — OB RESULTS CONSOLE GC/CHLAMYDIA
Chlamydia: NEGATIVE
GC PROBE AMP, GENITAL: NEGATIVE

## 2013-08-08 LAB — OB RESULTS CONSOLE HIV ANTIBODY (ROUTINE TESTING): HIV: NONREACTIVE

## 2013-08-08 LAB — OB RESULTS CONSOLE RPR: RPR: NONREACTIVE

## 2013-08-08 LAB — OB RESULTS CONSOLE ABO/RH: RH Type: POSITIVE

## 2013-08-08 LAB — OB RESULTS CONSOLE ANTIBODY SCREEN: Antibody Screen: NEGATIVE

## 2013-11-10 ENCOUNTER — Inpatient Hospital Stay (HOSPITAL_COMMUNITY)
Admission: AD | Admit: 2013-11-10 | Discharge: 2013-11-10 | Disposition: A | Discharge status: Home / Self Care | Payer: Medicaid Other | Source: Ambulatory Visit | Attending: Obstetrics and Gynecology | Admitting: Obstetrics and Gynecology

## 2013-11-10 ENCOUNTER — Encounter (HOSPITAL_COMMUNITY): Payer: Self-pay | Admitting: Family

## 2013-11-10 DIAGNOSIS — O4700 False labor before 37 completed weeks of gestation, unspecified trimester: Secondary | ICD-10-CM | POA: Insufficient documentation

## 2013-11-10 DIAGNOSIS — O479 False labor, unspecified: Secondary | ICD-10-CM

## 2013-11-10 DIAGNOSIS — K5900 Constipation, unspecified: Secondary | ICD-10-CM | POA: Insufficient documentation

## 2013-11-10 DIAGNOSIS — R109 Unspecified abdominal pain: Secondary | ICD-10-CM | POA: Insufficient documentation

## 2013-11-10 DIAGNOSIS — O99891 Other specified diseases and conditions complicating pregnancy: Secondary | ICD-10-CM | POA: Insufficient documentation

## 2013-11-10 LAB — URINE MICROSCOPIC-ADD ON

## 2013-11-10 LAB — URINALYSIS, ROUTINE W REFLEX MICROSCOPIC
Bilirubin Urine: NEGATIVE
Nitrite: NEGATIVE
Protein, ur: NEGATIVE mg/dL
Specific Gravity, Urine: 1.015 (ref 1.005–1.030)
Urobilinogen, UA: 0.2 mg/dL (ref 0.0–1.0)

## 2013-11-10 MED ORDER — FLEET ENEMA 7-19 GM/118ML RE ENEM
1.0000 | ENEMA | Freq: Once | RECTAL | Status: AC
  Administered 2013-11-10: 1 via RECTAL

## 2013-11-10 NOTE — MAU Provider Note (Signed)
History     CSN: [id number]  Arrival date and time: 11/10/13 1330   First Provider Initiated Contact with Patient 11/10/13 1427      No chief complaint on file.  HPI  Ms Afsheen Antony is a 27 y.o. female G3P2002 at [redacted]w[redacted]d who presents with abdominal pain that started this morning around 10:00. She describes the pain as "comes and goes, sharp, unsure if pain feels like contractions". The pain starts in her mid-right side of her abdomen and shoots down to the lower part of her abdomen. She currently rates her pain 8/10. She reports good fetal movement, denies LOF, vaginal bleeding, vaginal itching/burning, urinary symptoms, h/a, dizziness, n/v, or fever/chills.    OB History   Grav Para Term Preterm Abortions TAB SAB Ect Mult Living   3 2 2       2       Past Medical History  Diagnosis Date  . No pertinent past medical history     Past Surgical History  Procedure Laterality Date  . No past surgeries      History reviewed. No pertinent family history.  History  Substance Use Topics  . Smoking status: Never Smoker   . Smokeless tobacco: Not on file  . Alcohol Use: No    Allergies: No Known Allergies  Prescriptions prior to admission  Medication Sig Dispense Refill  . Prenatal Vit-Fe Fumarate-FA (PRENATAL MULTIVITAMIN) TABS Take 1 tablet by mouth daily.       Results for orders placed during the hospital encounter of 11/10/13 (from the past 24 hour(s))  URINALYSIS, ROUTINE W REFLEX MICROSCOPIC     Status: Abnormal   Collection Time    11/10/13  1:54 PM      Result Value Range   Color, Urine YELLOW  YELLOW   APPearance CLEAR  CLEAR   Specific Gravity, Urine 1.015  1.005 - 1.030   pH 8.0  5.0 - 8.0   Glucose, UA NEGATIVE  NEGATIVE mg/dL   Hgb urine dipstick NEGATIVE  NEGATIVE   Bilirubin Urine NEGATIVE  NEGATIVE   Ketones, ur NEGATIVE  NEGATIVE mg/dL   Protein, ur NEGATIVE  NEGATIVE mg/dL   Urobilinogen, UA 0.2  0.0 - 1.0 mg/dL   Nitrite NEGATIVE  NEGATIVE   Leukocytes, UA SMALL (*) NEGATIVE  URINE MICROSCOPIC-ADD ON     Status: Abnormal   Collection Time    11/10/13  1:54 PM      Result Value Range   Squamous Epithelial / LPF FEW (*) RARE   WBC, UA 3-6  <3 WBC/hpf   Urine-Other MUCOUS PRESENT       Review of Systems  Constitutional: Negative for fever and chills.  Gastrointestinal: Positive for abdominal pain and constipation. Negative for nausea, vomiting and diarrhea.  Genitourinary: Negative for dysuria, urgency, frequency and hematuria.       No vaginal discharge. No vaginal bleeding. No dysuria.   Neurological: Negative for headaches.   Physical Exam   Blood pressure 111/52, pulse 97, temperature 98 F (36.7 C), temperature source Oral, resp. rate 18, last menstrual period 02/23/2013.  Physical Exam  Constitutional: She is oriented to person, place, and time. She appears well-developed and well-nourished. No distress.  HENT:  Head: Normocephalic.  Eyes: Pupils are equal, round, and reactive to light.  Neck: Neck supple.  Respiratory: Effort normal.  GI: Soft. There is tenderness.  Suprapubic tenderness.   Genitourinary:  Bimanual exam: Large amount of hard stool felt in the colon  Musculoskeletal: Normal range  of motion.  Neurological: She is alert and oriented to person, place, and time.  Skin: Skin is warm. She is not diaphoretic.    Fetal Tracing: Baseline: 125 bpm Variability: Moderate  Accelerations: 15x15 Decelerations: none Toco: UI  Dilation: Closed Effacement (%): Thick Station: Ballotable Exam by:: Blanche East, NP   MAU Course  Procedures None  MDM UA Consulted with Dr. Henderson Cloud; plan to give fleets enema.  Pt had large BM following Fleets enema; denies pain, rates 0/10   Assessment and Plan   A:  1. Constipation   2. Abdominal pain   3. Braxton Hick's contraction    P: Discharge home in stable condition A list of safe medications to take in pregnancy for constipation provided to the  patient Follow up in the office as scheduled Preterm labor precautions Kick counts  Iona Hansen Bhavin Monjaraz, NP  11/10/2013, 2:27 PM

## 2013-11-10 NOTE — MAU Note (Signed)
27 yo, G3P2 at [redacted]w[redacted]d, presents to MAU with c/o RLQ pain since 1000 today. Reports +FM. Denies VB, LOF. No hx of preterm labor. Denies HSV.

## 2013-11-22 NOTE — L&D Delivery Note (Addendum)
Patient was C/C/+3 and pushed for <635minutes with epidural.  Although head was low, maternal pushing was inadequate and fetal heart rate decelerations were present. Vacuum was applied to assist, and  female infant, Apgars 8/9, was delivered with 2 pushes and zero pop-offs, LOA.  Maternal effort was still inadequate for delivery of shoulders, McRoberts and gentle downward traction were unsuccessful, delivery of posterior arm was performed without difficulty and infants shoulders and remaining body delivered easily.   The patient had small first degree lacerations repaired with 3-0 vicryl figure of eight. Fundus was firm. EBL was expected. Placenta was delivered intact. Vagina was clear.  Baby was vigorous and doing skin to skin with mother.  Philip AspenALLAHAN, Roberta Galvan

## 2013-11-27 LAB — OB RESULTS CONSOLE GBS: STREP GROUP B AG: NEGATIVE

## 2013-12-07 ENCOUNTER — Encounter (HOSPITAL_COMMUNITY): Payer: Self-pay | Admitting: *Deleted

## 2013-12-07 ENCOUNTER — Inpatient Hospital Stay (HOSPITAL_COMMUNITY)
Admission: AD | Admit: 2013-12-07 | Discharge: 2013-12-07 | Disposition: A | Discharge status: Home / Self Care | Payer: Medicaid Other | Source: Ambulatory Visit | Attending: Obstetrics and Gynecology | Admitting: Obstetrics and Gynecology

## 2013-12-07 DIAGNOSIS — O479 False labor, unspecified: Secondary | ICD-10-CM | POA: Insufficient documentation

## 2013-12-07 HISTORY — DX: Adverse effect of unspecified anesthetic, initial encounter: T41.45XA

## 2013-12-07 HISTORY — DX: Other complications of anesthesia, initial encounter: T88.59XA

## 2013-12-07 NOTE — MAU Note (Signed)
PT SAYS SHE  STARTED HURTING BAD AT 0100.   VE IN OFFICE ON WED - 1 CM.  DENIES HSV AND MRSA.

## 2013-12-28 ENCOUNTER — Telehealth (HOSPITAL_COMMUNITY): Payer: Self-pay | Admitting: *Deleted

## 2013-12-28 ENCOUNTER — Encounter (HOSPITAL_COMMUNITY): Payer: Self-pay | Admitting: *Deleted

## 2013-12-28 NOTE — Telephone Encounter (Signed)
Preadmission screen  

## 2013-12-29 ENCOUNTER — Inpatient Hospital Stay (HOSPITAL_COMMUNITY)
Admission: AD | Admit: 2013-12-29 | Discharge: 2013-12-31 | DRG: 775 | Disposition: A | Discharge status: Home / Self Care | Payer: Medicaid Other | Source: Ambulatory Visit | Attending: Obstetrics and Gynecology | Admitting: Obstetrics and Gynecology

## 2013-12-29 ENCOUNTER — Encounter (HOSPITAL_COMMUNITY): Payer: Medicaid Other | Admitting: Anesthesiology

## 2013-12-29 ENCOUNTER — Encounter (HOSPITAL_COMMUNITY): Payer: Self-pay | Admitting: *Deleted

## 2013-12-29 ENCOUNTER — Inpatient Hospital Stay (HOSPITAL_COMMUNITY): Payer: Medicaid Other | Admitting: Anesthesiology

## 2013-12-29 DIAGNOSIS — E669 Obesity, unspecified: Secondary | ICD-10-CM | POA: Diagnosis present

## 2013-12-29 DIAGNOSIS — O99214 Obesity complicating childbirth: Secondary | ICD-10-CM

## 2013-12-29 DIAGNOSIS — Z3490 Encounter for supervision of normal pregnancy, unspecified, unspecified trimester: Secondary | ICD-10-CM

## 2013-12-29 LAB — CBC
HCT: 33.9 % — ABNORMAL LOW (ref 36.0–46.0)
Hemoglobin: 11.7 g/dL — ABNORMAL LOW (ref 12.0–15.0)
MCH: 31 pg (ref 26.0–34.0)
MCHC: 34.5 g/dL (ref 30.0–36.0)
MCV: 89.7 fL (ref 78.0–100.0)
Platelets: 129 10*3/uL — ABNORMAL LOW (ref 150–400)
RBC: 3.78 MIL/uL — AB (ref 3.87–5.11)
RDW: 14.2 % (ref 11.5–15.5)
WBC: 6.4 10*3/uL (ref 4.0–10.5)

## 2013-12-29 LAB — TYPE AND SCREEN
ABO/RH(D): A POS
Antibody Screen: NEGATIVE

## 2013-12-29 MED ORDER — EPHEDRINE 5 MG/ML INJ
10.0000 mg | INTRAVENOUS | Status: DC | PRN
  Filled 2013-12-29: qty 2

## 2013-12-29 MED ORDER — EPHEDRINE 5 MG/ML INJ
10.0000 mg | INTRAVENOUS | Status: DC | PRN
Start: 2013-12-29 — End: 2013-12-30
  Filled 2013-12-29: qty 2
  Filled 2013-12-29: qty 4

## 2013-12-29 MED ORDER — ACETAMINOPHEN 325 MG PO TABS: 650.0000 mg | ORAL_TABLET | ORAL | Status: DC | PRN

## 2013-12-29 MED ORDER — OXYCODONE-ACETAMINOPHEN 5-325 MG PO TABS: 1.0000 | ORAL_TABLET | ORAL | Status: DC | PRN

## 2013-12-29 MED ORDER — FLEET ENEMA 7-19 GM/118ML RE ENEM: 1.0000 | ENEMA | RECTAL | Status: DC | PRN

## 2013-12-29 MED ORDER — FENTANYL 2.5 MCG/ML BUPIVACAINE 1/10 % EPIDURAL INFUSION (WH - ANES)
14.0000 mL/h | INTRAMUSCULAR | Status: DC | PRN
  Administered 2013-12-29: 14 mL/h via EPIDURAL
  Filled 2013-12-29: qty 125

## 2013-12-29 MED ORDER — LACTATED RINGERS IV SOLN
INTRAVENOUS | Status: DC
  Administered 2013-12-29: 19:00:00 via INTRAVENOUS

## 2013-12-29 MED ORDER — DIPHENHYDRAMINE HCL 50 MG/ML IJ SOLN: 12.5000 mg | INTRAMUSCULAR | Status: DC | PRN

## 2013-12-29 MED ORDER — IBUPROFEN 600 MG PO TABS: 600.0000 mg | ORAL_TABLET | Freq: Four times a day (QID) | ORAL | Status: DC | PRN

## 2013-12-29 MED ORDER — LIDOCAINE HCL (PF) 1 % IJ SOLN
30.0000 mL | INTRAMUSCULAR | Status: DC | PRN
  Filled 2013-12-29 (×2): qty 30

## 2013-12-29 MED ORDER — OXYTOCIN BOLUS FROM INFUSION
500.0000 mL | INTRAVENOUS | Status: DC
  Administered 2013-12-29: 500 mL via INTRAVENOUS

## 2013-12-29 MED ORDER — ONDANSETRON HCL 4 MG/2ML IJ SOLN: 4.0000 mg | Freq: Four times a day (QID) | INTRAMUSCULAR | Status: DC | PRN

## 2013-12-29 MED ORDER — BUTORPHANOL TARTRATE 1 MG/ML IJ SOLN
1.0000 mg | Freq: Once | INTRAMUSCULAR | Status: AC
  Administered 2013-12-29: 1 mg via INTRAVENOUS
  Filled 2013-12-29: qty 1

## 2013-12-29 MED ORDER — SODIUM BICARBONATE 8.4 % IV SOLN
INTRAVENOUS | Status: DC | PRN
  Administered 2013-12-29: 5 mL via EPIDURAL

## 2013-12-29 MED ORDER — LACTATED RINGERS IV SOLN: 500.0000 mL | INTRAVENOUS | Status: DC | PRN

## 2013-12-29 MED ORDER — LACTATED RINGERS IV SOLN
500.0000 mL | Freq: Once | INTRAVENOUS | Status: AC
  Administered 2013-12-29: 500 mL via INTRAVENOUS

## 2013-12-29 MED ORDER — CITRIC ACID-SODIUM CITRATE 334-500 MG/5ML PO SOLN: 30.0000 mL | ORAL | Status: DC | PRN

## 2013-12-29 MED ORDER — OXYTOCIN 40 UNITS IN LACTATED RINGERS INFUSION - SIMPLE MED
62.5000 mL/h | INTRAVENOUS | Status: DC
  Filled 2013-12-29: qty 1000

## 2013-12-29 MED ORDER — PHENYLEPHRINE 40 MCG/ML (10ML) SYRINGE FOR IV PUSH (FOR BLOOD PRESSURE SUPPORT)
80.0000 ug | PREFILLED_SYRINGE | INTRAVENOUS | Status: DC | PRN
  Filled 2013-12-29: qty 10
  Filled 2013-12-29: qty 2

## 2013-12-29 MED ORDER — PHENYLEPHRINE 40 MCG/ML (10ML) SYRINGE FOR IV PUSH (FOR BLOOD PRESSURE SUPPORT)
80.0000 ug | PREFILLED_SYRINGE | INTRAVENOUS | Status: DC | PRN
  Filled 2013-12-29: qty 2

## 2013-12-29 NOTE — Anesthesia Procedure Notes (Signed)

## 2013-12-29 NOTE — H&P (Signed)
28 y.o. 5533w6d  G3P2002 comes in c/o ctx.  Otherwise has good fetal movement and no bleeding.  She denies any complications with prior pregnancies.  Past Medical History  Diagnosis Date  . No pertinent past medical history   . Complication of anesthesia     NUMBNESS IN  LEGS    Past Surgical History  Procedure Laterality Date  . No past surgeries      OB History  Gravida Para Term Preterm AB SAB TAB Ectopic Multiple Living  3 2 2       2     # Outcome Date GA Lbr Len/2nd Weight Sex Delivery Anes PTL Lv  3 CUR           2 TRM 12/31/11 6556w3d 07:42 / 00:19 3.396 kg (7 lb 7.8 oz) F SVD EPI  Y  1 TRM 2008 4562w0d 24:00 3.374 kg (7 lb 7 oz) F SVD EPI  Y      History   Social History  . Marital Status: Married    Spouse Name: N/A    Number of Children: N/A  . Years of Education: N/A   Occupational History  . Not on file.   Social History Main Topics  . Smoking status: Never Smoker   . Smokeless tobacco: Never Used  . Alcohol Use: No  . Drug Use: No  . Sexual Activity: Yes    Birth Control/ Protection: None   Other Topics Concern  . Not on file   Social History Narrative  . No narrative on file   Review of patient's allergies indicates no known allergies.    Prenatal Transfer Tool  Maternal Diabetes: No Genetic Screening: Declined Maternal Ultrasounds/Referrals: Normal Fetal Ultrasounds or other Referrals:  None Maternal Substance Abuse:  No Significant Maternal Medications:  none Significant Maternal Lab Results: Lab values include: Group B Strep negative  Other PNC: uncomplicated.    Filed Vitals:   12/29/13 1847  BP: 124/90  Pulse: 66  Temp:   Resp:      Lungs/Cor:  NAD Abdomen:  soft, gravid Ex:  no cords, erythema SVE:  2cm in MAU, 6/70/-1 in BS FHTs:  125, good STV, NST R, early variables noted occasionally Toco:  q2-4   A/P   Admit to L&D in labor  GBS Neg  AROM clear fluid  Declines epidural  Other routine care  Bodega BayALLAHAN, Luther ParodySIDNEY

## 2013-12-29 NOTE — MAU Note (Signed)
Pt presents with complaints of contractions that started this morning and have gotten worse throughout the day. Denies any bleeding or LOF>
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
## 2013-12-29 NOTE — Anesthesia Preprocedure Evaluation (Signed)

## 2013-12-30 ENCOUNTER — Encounter (HOSPITAL_COMMUNITY): Payer: Self-pay | Admitting: *Deleted

## 2013-12-30 ENCOUNTER — Inpatient Hospital Stay (HOSPITAL_COMMUNITY): Admission: RE | Admit: 2013-12-30 | Payer: Medicaid Other | Source: Ambulatory Visit

## 2013-12-30 LAB — CBC
HCT: 30.9 % — ABNORMAL LOW (ref 36.0–46.0)
HEMOGLOBIN: 10.6 g/dL — AB (ref 12.0–15.0)
MCH: 31.1 pg (ref 26.0–34.0)
MCHC: 34.3 g/dL (ref 30.0–36.0)
MCV: 90.6 fL (ref 78.0–100.0)
Platelets: 140 10*3/uL — ABNORMAL LOW (ref 150–400)
RBC: 3.41 MIL/uL — ABNORMAL LOW (ref 3.87–5.11)
RDW: 13.8 % (ref 11.5–15.5)
WBC: 9.5 10*3/uL (ref 4.0–10.5)

## 2013-12-30 LAB — RPR: RPR Ser Ql: NONREACTIVE

## 2013-12-30 MED ORDER — LANOLIN HYDROUS EX OINT: TOPICAL_OINTMENT | CUTANEOUS | Status: DC | PRN

## 2013-12-30 MED ORDER — TETANUS-DIPHTH-ACELL PERTUSSIS 5-2.5-18.5 LF-MCG/0.5 IM SUSP
0.5000 mL | Freq: Once | INTRAMUSCULAR | Status: AC
  Administered 2013-12-30: 0.5 mL via INTRAMUSCULAR
  Filled 2013-12-30: qty 0.5

## 2013-12-30 MED ORDER — BENZOCAINE-MENTHOL 20-0.5 % EX AERO
1.0000 "application " | INHALATION_SPRAY | CUTANEOUS | Status: DC | PRN
  Administered 2013-12-31: 1 via TOPICAL
  Filled 2013-12-30: qty 56

## 2013-12-30 MED ORDER — IBUPROFEN 600 MG PO TABS
600.0000 mg | ORAL_TABLET | Freq: Four times a day (QID) | ORAL | Status: DC
  Administered 2013-12-30 – 2013-12-31 (×5): 600 mg via ORAL
  Filled 2013-12-30 (×5): qty 1

## 2013-12-30 MED ORDER — ZOLPIDEM TARTRATE 5 MG PO TABS: 5.0000 mg | ORAL_TABLET | Freq: Every evening | ORAL | Status: DC | PRN

## 2013-12-30 MED ORDER — ONDANSETRON HCL 4 MG PO TABS: 4.0000 mg | ORAL_TABLET | ORAL | Status: DC | PRN

## 2013-12-30 MED ORDER — SENNOSIDES-DOCUSATE SODIUM 8.6-50 MG PO TABS
2.0000 | ORAL_TABLET | ORAL | Status: DC
  Administered 2013-12-30 (×2): 2 via ORAL
  Filled 2013-12-30 (×2): qty 2

## 2013-12-30 MED ORDER — DIBUCAINE 1 % RE OINT: 1.0000 "application " | TOPICAL_OINTMENT | RECTAL | Status: DC | PRN

## 2013-12-30 MED ORDER — ONDANSETRON HCL 4 MG/2ML IJ SOLN: 4.0000 mg | INTRAMUSCULAR | Status: DC | PRN

## 2013-12-30 MED ORDER — OXYCODONE-ACETAMINOPHEN 5-325 MG PO TABS
1.0000 | ORAL_TABLET | ORAL | Status: DC | PRN
  Administered 2013-12-30 – 2013-12-31 (×3): 1 via ORAL
  Filled 2013-12-30 (×3): qty 1

## 2013-12-30 MED ORDER — WITCH HAZEL-GLYCERIN EX PADS: 1.0000 "application " | MEDICATED_PAD | CUTANEOUS | Status: DC | PRN

## 2013-12-30 MED ORDER — SIMETHICONE 80 MG PO CHEW: 80.0000 mg | CHEWABLE_TABLET | ORAL | Status: DC | PRN

## 2013-12-30 MED ORDER — DIPHENHYDRAMINE HCL 25 MG PO CAPS: 25.0000 mg | ORAL_CAPSULE | Freq: Four times a day (QID) | ORAL | Status: DC | PRN

## 2013-12-30 MED ORDER — PRENATAL MULTIVITAMIN CH
1.0000 | ORAL_TABLET | Freq: Every day | ORAL | Status: DC
  Administered 2013-12-30: 1 via ORAL
  Filled 2013-12-30: qty 1

## 2013-12-30 NOTE — Anesthesia Postprocedure Evaluation (Signed)
Anesthesia Post Note  Patient: Roberta Galvan  Procedure(s) Performed: * No procedures listed *  Anesthesia type: Epidural  Patient location: Mother/Baby  Post pain: Pain level controlled  Post assessment: Post-op Vital signs reviewed  Last Vitals:  Filed Vitals:   12/30/13 0612  BP: 107/58  Pulse: 62  Temp: 36.9 C  Resp: 18    Post vital signs: Reviewed  Level of consciousness:alert  Complications: No apparent anesthesia complications

## 2013-12-30 NOTE — Lactation Note (Addendum)
This note was copied from the chart of Roberta Galvan Monsivais. Lactation Consultation Note: Initial visit with mom. She reports that it hurts while she was breast feeding but her nurse helped her and now it is better. Has given formula because she thought the baby was still hungry.Reviewed wide open mouth and keeping the baby close to the breast throughout the feeding. Encouraged to always breast feed first and then give formula if baby is still hungry- we want to promote a good milk supply. BF brochure given with resources for support after DC. Baby asleep on mom's chest- last fed about 1 hour ago. Reviewed feeding cues and encouraged to feed whenever she sees them No questions at present. To call for assist prn  Patient Name: Roberta Renita PapaMadusu Lalli ZOXWR'UToday's Date: 12/30/2013 Reason for consult: Initial assessment   Maternal Data Formula Feeding for Exclusion: Yes Reason for exclusion: Mother's choice to formula and breast feed on admission Infant to breast within first hour of birth: Yes Does the patient have breastfeeding experience prior to this delivery?: Yes  Feeding   LATCH Score/Interventions                      Lactation Tools Discussed/Used     Consult Status Consult Status: Follow-up Date: 12/31/13 Follow-up type: In-patient    Pamelia HoitWeeks, Lyra Alaimo D 12/30/2013, 2:25 PM

## 2013-12-30 NOTE — Progress Notes (Signed)
Patient is eating, ambulating, voiding.  Pain control is good.  Appropriate lochia.  No complaints.  Filed Vitals:   12/30/13 0016 12/30/13 0130 12/30/13 0300 12/30/13 0612  BP: 107/60 100/59 113/73 107/58  Pulse: 66 71 58 62  Temp:  99.1 F (37.3 C) 98.6 F (37 C) 98.4 F (36.9 C)  TempSrc:      Resp:  18 18 18   Height:      Weight:      SpO2:  99% 99% 98%    Fundus firm, NT Perineum without swelling.  Lab Results  Component Value Date   WBC 9.5 12/30/2013   HGB 10.6* 12/30/2013   HCT 30.9* 12/30/2013   MCV 90.6 12/30/2013   PLT 140* 12/30/2013    --/--/A POS (02/07 1850)  A/P Post partum day 1. Circ performed.  Routine care.  Expect d/c 2/9.    Philip AspenALLAHAN, Rebecca Motta

## 2013-12-31 MED ORDER — OXYCODONE-ACETAMINOPHEN 5-325 MG PO TABS: 2.0000 | ORAL_TABLET | ORAL | Status: DC | PRN

## 2013-12-31 MED ORDER — DOCUSATE SODIUM 100 MG PO CAPS: 100.0000 mg | ORAL_CAPSULE | Freq: Two times a day (BID) | ORAL | Status: DC

## 2013-12-31 MED ORDER — IBUPROFEN 600 MG PO TABS: 600.0000 mg | ORAL_TABLET | Freq: Four times a day (QID) | ORAL | Status: DC | PRN

## 2013-12-31 NOTE — Discharge Summary (Signed)
Obstetric Discharge Summary Reason for Admission: onset of labor Prenatal Procedures: NST and ultrasound Intrapartum Procedures: vacuum Postpartum Procedures: none Complications-Operative and Postpartum: 1st degree perineal laceration Hemoglobin  Date Value Range Status  12/30/2013 10.6* 12.0 - 15.0 g/dL Final     HCT  Date Value Range Status  12/30/2013 30.9* 36.0 - 46.0 % Final    Physical Exam:  General: alert, cooperative and appears stated age 57Lochia: appropriate Uterine Fundus: firm  Discharge Diagnoses: Term Pregnancy-delivered  Discharge Information: Date: 12/31/2013 Activity: pelvic rest Diet: routine Medications: Ibuprofen, Colace and Percocet Condition: improved Instructions: refer to practice specific booklet Discharge to: home Follow-up Information   Follow up with CALLAHAN, SIDNEY, DO In 4 weeks. (For a postpartum check)    Specialty:  Obstetrics and Gynecology   Contact information:   [address] [place] [address] [phone]       Newborn Data: Live born female  Birth Weight: 6 lb 4 oz (2835 g) APGAR: 8, 9  Home with mother.  Andrey Hoobler H. 12/31/2013, 8:50 AM

## 2013-12-31 NOTE — Progress Notes (Signed)
UR chart review completed.  

## 2014-09-23 ENCOUNTER — Encounter (HOSPITAL_COMMUNITY): Payer: Self-pay | Admitting: *Deleted

## 2015-02-19 ENCOUNTER — Emergency Department (HOSPITAL_COMMUNITY)
Admission: EM | Admit: 2015-02-19 | Discharge: 2015-02-19 | Disposition: A | Discharge status: Home / Self Care | Payer: No Typology Code available for payment source | Attending: Emergency Medicine | Admitting: Emergency Medicine

## 2015-02-19 ENCOUNTER — Encounter (HOSPITAL_COMMUNITY): Payer: Self-pay | Admitting: Emergency Medicine

## 2015-02-19 ENCOUNTER — Emergency Department (HOSPITAL_COMMUNITY): Payer: No Typology Code available for payment source

## 2015-02-19 ENCOUNTER — Other Ambulatory Visit: Payer: Self-pay

## 2015-02-19 DIAGNOSIS — Z3202 Encounter for pregnancy test, result negative: Secondary | ICD-10-CM | POA: Insufficient documentation

## 2015-02-19 DIAGNOSIS — Y92410 Unspecified street and highway as the place of occurrence of the external cause: Secondary | ICD-10-CM | POA: Diagnosis not present

## 2015-02-19 DIAGNOSIS — S3992XA Unspecified injury of lower back, initial encounter: Secondary | ICD-10-CM | POA: Insufficient documentation

## 2015-02-19 DIAGNOSIS — Y998 Other external cause status: Secondary | ICD-10-CM | POA: Diagnosis not present

## 2015-02-19 DIAGNOSIS — S29001A Unspecified injury of muscle and tendon of front wall of thorax, initial encounter: Secondary | ICD-10-CM | POA: Insufficient documentation

## 2015-02-19 DIAGNOSIS — Y9389 Activity, other specified: Secondary | ICD-10-CM | POA: Insufficient documentation

## 2015-02-19 DIAGNOSIS — S199XXA Unspecified injury of neck, initial encounter: Secondary | ICD-10-CM | POA: Insufficient documentation

## 2015-02-19 LAB — URINALYSIS, ROUTINE W REFLEX MICROSCOPIC
Bilirubin Urine: NEGATIVE
Glucose, UA: NEGATIVE mg/dL
HGB URINE DIPSTICK: NEGATIVE
Ketones, ur: NEGATIVE mg/dL
NITRITE: NEGATIVE
PH: 6.5 (ref 5.0–8.0)
PROTEIN: NEGATIVE mg/dL
SPECIFIC GRAVITY, URINE: 1.019 (ref 1.005–1.030)
Urobilinogen, UA: 0.2 mg/dL (ref 0.0–1.0)

## 2015-02-19 LAB — CBC WITH DIFFERENTIAL/PLATELET
BASOS ABS: 0 10*3/uL (ref 0.0–0.1)
BASOS PCT: 0 % (ref 0–1)
EOS PCT: 1 % (ref 0–5)
Eosinophils Absolute: 0.1 10*3/uL (ref 0.0–0.7)
HEMATOCRIT: 37.8 % (ref 36.0–46.0)
Hemoglobin: 12 g/dL (ref 12.0–15.0)
LYMPHS ABS: 2 10*3/uL (ref 0.7–4.0)
Lymphocytes Relative: 38 % (ref 12–46)
MCH: 29.1 pg (ref 26.0–34.0)
MCHC: 31.7 g/dL (ref 30.0–36.0)
MCV: 91.7 fL (ref 78.0–100.0)
Monocytes Absolute: 0.5 10*3/uL (ref 0.1–1.0)
Monocytes Relative: 9 % (ref 3–12)
NEUTROS ABS: 2.8 10*3/uL (ref 1.7–7.7)
Neutrophils Relative %: 52 % (ref 43–77)
Platelets: 131 10*3/uL — ABNORMAL LOW (ref 150–400)
RBC: 4.12 MIL/uL (ref 3.87–5.11)
RDW: 15.3 % (ref 11.5–15.5)
WBC: 5.4 10*3/uL (ref 4.0–10.5)

## 2015-02-19 LAB — COMPREHENSIVE METABOLIC PANEL
ALBUMIN: 4.1 g/dL (ref 3.5–5.2)
ALK PHOS: 56 U/L (ref 39–117)
ALT: 31 U/L (ref 0–35)
ANION GAP: 7 (ref 5–15)
AST: 63 U/L — AB (ref 0–37)
BUN: 13 mg/dL (ref 6–23)
CHLORIDE: 106 mmol/L (ref 96–112)
CO2: 21 mmol/L (ref 19–32)
Calcium: 8.5 mg/dL (ref 8.4–10.5)
Creatinine, Ser: 1 mg/dL (ref 0.50–1.10)
GFR calc Af Amer: 87 mL/min — ABNORMAL LOW (ref >90)
GFR, EST NON AFRICAN AMERICAN: 75 mL/min — AB (ref >90)
Glucose, Bld: 78 mg/dL (ref 70–99)
Potassium: 6.9 mmol/L (ref 3.5–5.1)
SODIUM: 134 mmol/L — AB (ref 135–145)
Total Bilirubin: 0.3 mg/dL (ref 0.3–1.2)
Total Protein: 7.3 g/dL (ref 6.0–8.3)

## 2015-02-19 LAB — I-STAT CHEM 8, ED
BUN: 10 mg/dL (ref 6–23)
CALCIUM ION: 1.23 mmol/L (ref 1.12–1.23)
CHLORIDE: 104 mmol/L (ref 96–112)
CREATININE: 0.7 mg/dL (ref 0.50–1.10)
GLUCOSE: 81 mg/dL (ref 70–99)
HCT: 38 % (ref 36.0–46.0)
HEMOGLOBIN: 12.9 g/dL (ref 12.0–15.0)
Potassium: 3.5 mmol/L (ref 3.5–5.1)
Sodium: 140 mmol/L (ref 135–145)
TCO2: 20 mmol/L (ref 0–100)

## 2015-02-19 LAB — URINE MICROSCOPIC-ADD ON

## 2015-02-19 LAB — POC URINE PREG, ED: Preg Test, Ur: NEGATIVE

## 2015-02-19 MED ORDER — OXYCODONE-ACETAMINOPHEN 5-325 MG PO TABS
1.0000 | ORAL_TABLET | Freq: Once | ORAL | Status: AC
  Administered 2015-02-19: 1 via ORAL
  Filled 2015-02-19: qty 1
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
## 2015-02-19 NOTE — Discharge Instructions (Signed)
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
## 2015-02-21 ENCOUNTER — Emergency Department (HOSPITAL_COMMUNITY)
Admission: EM | Admit: 2015-02-21 | Discharge: 2015-02-21 | Disposition: A | Discharge status: Home / Self Care | Payer: No Typology Code available for payment source | Attending: Emergency Medicine | Admitting: Emergency Medicine

## 2015-02-21 ENCOUNTER — Encounter (HOSPITAL_COMMUNITY): Payer: Self-pay | Admitting: *Deleted

## 2015-02-21 DIAGNOSIS — M542 Cervicalgia: Secondary | ICD-10-CM | POA: Diagnosis not present

## 2015-02-21 DIAGNOSIS — R319 Hematuria, unspecified: Secondary | ICD-10-CM | POA: Insufficient documentation

## 2015-02-21 DIAGNOSIS — R079 Chest pain, unspecified: Secondary | ICD-10-CM | POA: Insufficient documentation

## 2015-02-21 DIAGNOSIS — M7918 Myalgia, other site: Secondary | ICD-10-CM

## 2015-02-21 DIAGNOSIS — R3915 Urgency of urination: Secondary | ICD-10-CM | POA: Insufficient documentation

## 2015-02-21 DIAGNOSIS — M791 Myalgia: Secondary | ICD-10-CM | POA: Insufficient documentation

## 2015-02-21 DIAGNOSIS — M549 Dorsalgia, unspecified: Secondary | ICD-10-CM | POA: Diagnosis present

## 2015-02-21 DIAGNOSIS — R350 Frequency of micturition: Secondary | ICD-10-CM | POA: Insufficient documentation

## 2015-02-21 DIAGNOSIS — G8911 Acute pain due to trauma: Secondary | ICD-10-CM | POA: Insufficient documentation

## 2015-02-21 LAB — URINE MICROSCOPIC-ADD ON

## 2015-02-21 LAB — URINALYSIS, ROUTINE W REFLEX MICROSCOPIC
Bilirubin Urine: NEGATIVE
GLUCOSE, UA: NEGATIVE mg/dL
Hgb urine dipstick: NEGATIVE
Ketones, ur: NEGATIVE mg/dL
Nitrite: NEGATIVE
PH: 6 (ref 5.0–8.0)
Protein, ur: NEGATIVE mg/dL
Specific Gravity, Urine: 1.016 (ref 1.005–1.030)
Urobilinogen, UA: 0.2 mg/dL (ref 0.0–1.0)

## 2015-02-21 MED ORDER — CYCLOBENZAPRINE HCL 10 MG PO TABS: 10.0000 mg | ORAL_TABLET | Freq: Three times a day (TID) | ORAL | Status: DC | PRN

## 2015-02-21 MED ORDER — IBUPROFEN 800 MG PO TABS: 800.0000 mg | ORAL_TABLET | Freq: Three times a day (TID) | ORAL | Status: DC | PRN

## 2015-02-21 MED ORDER — DIAZEPAM 5 MG PO TABS
5.0000 mg | ORAL_TABLET | Freq: Once | ORAL | Status: AC
  Administered 2015-02-21: 5 mg via ORAL
  Filled 2015-02-21: qty 1
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
## 2015-02-23 LAB — URINE CULTURE

## 2015-07-31 ENCOUNTER — Ambulatory Visit (INDEPENDENT_AMBULATORY_CARE_PROVIDER_SITE_OTHER): Payer: BC Managed Care – PPO | Admitting: Family Medicine

## 2015-07-31 ENCOUNTER — Encounter: Payer: Self-pay | Admitting: Family Medicine

## 2015-07-31 VITALS — BP 116/64 | HR 64 | Ht 65.5 in | Wt 216.8 lb

## 2015-07-31 DIAGNOSIS — R51 Headache: Secondary | ICD-10-CM | POA: Diagnosis not present

## 2015-07-31 DIAGNOSIS — R519 Headache, unspecified: Secondary | ICD-10-CM

## 2015-07-31 MED ORDER — IBUPROFEN 800 MG PO TABS: 800.0000 mg | ORAL_TABLET | Freq: Three times a day (TID) | ORAL | Status: DC | PRN

## 2015-07-31 NOTE — Progress Notes (Signed)
   Subjective:    Patient ID: Roberta Galvan, female    DOB: Feb 25, 1986, 29 y.o.   MRN: [id number]  HPI She is here for a one-week history of headache and neck ache. She states the pain is intermittent and comes on when she wakes up some days and other times in the afternoon or evening and last approximately 2-3 hours. The pain is located behind both eyes and in the back of her neck. The pain is a dull and sometimes throbbing.  She states there is no specific pattern to her headaches and she does not get a warning. Headaches are not keeping her awake at night. She reports that sometimes the light hurts her eyes and noise is bothersome.She is able to keep working when she has these headaches. She denies nausea, vomiting, dizziness, weakness, numbness, tingling. She reports a history of similar headaches and thinks she was told she had migraines. She states she has been taking 200 mg of ibuprofen 1 time per day and that the headache usually goes away by doing this. She does not smoke. She does not drink caffeine. Denies family history of headaches.   She occasionally skips breakfast. Gets 6-7 hours of sleep each night.  She denies fever, chills, unexplained weight loss, or malaise.  She works in Stage manager at [employer].    Review of Systems Pertinent positives and negatives in the history of present illness.    Objective:   Physical Exam  Constitutional: She is oriented to person, place, and time. She appears well-developed and well-nourished. No distress.  HENT:  Head: Normocephalic and atraumatic.  Right Ear: External ear normal.  Left Ear: External ear normal.  Nose: Nose normal.  Mouth/Throat: Uvula is midline, oropharynx is clear and moist and mucous membranes are normal.  Eyes: Conjunctivae and EOM are normal. Pupils are equal, round, and reactive to light.  Neck: Normal range of motion and full passive range of motion without pain. Neck supple. No spinous process tenderness present.    Neurological: She is alert and oriented to person, place, and time. She has normal strength. No cranial nerve deficit or sensory deficit. Gait normal.  Skin: Skin is warm and dry. No rash noted.           Assessment & Plan:  Headache behind the eyes  She will take 800 mg of ibuprofen at onset of headache. Suggested that she can also try caffeine if the ibuprofen is not helping. Discussed rebound headaches if she is taking too much pain medication. Also discussed that I am not able to discern if her headaches are migraines vs tension/stress headaches so we will try to figure this out.   Encouraged her to let me know if the treatment we discussed today is not working and she continues to have pain or is having more than 2 or 3 headaches each week. Educated her on staying hydrated, getting adequate sleep, and not skipping meals. Encouraged her to keep a headache diary so that we can determine if there are any particular triggers.  I do not have any medical records from her she will try to get these and schedule a complete physical exam in the near future.

## 2015-07-31 NOTE — Patient Instructions (Addendum)
I would like for you to keep a headache journal that includes time of day that you get your headache and if you had skipped a meal or not slept the night before etc. as soon as she feel the headache, on take 800 mg of ibuprofen and drink plenty of water. If this does not make the headache go away you can try caffeine. Your headaches do not sound like classic migraine headaches today but we will keep an eye on this and please let me know if you are continuing to have them and the ibuprofen is not working. If your headaches are tension headaches you might benefit from using heat to the back of your neck and stretching like we discussed today.   General Headache Without Cause A headache is pain or discomfort felt around the head or neck area. The specific cause of a headache may not be found. There are many causes and types of headaches. A few common ones are:  Tension headaches.  Migraine headaches.  Cluster headaches.  Chronic daily headaches. HOME CARE INSTRUCTIONS   Keep all follow-up appointments with your caregiver or any specialist referral.  Only take over-the-counter or prescription medicines for pain or discomfort as directed by your caregiver.  Lie down in a dark, quiet room when you have a headache.  Keep a headache journal to find out what may trigger your migraine headaches. For example, write down:  What you eat and drink.  How much sleep you get.  Any change to your diet or medicines.  Try massage or other relaxation techniques.  Put ice packs or heat on the head and neck. Use these 3 to 4 times per day for 15 to 20 minutes each time, or as needed.  Limit stress.  Sit up straight, and do not tense your muscles.  Quit smoking if you smoke.  Limit alcohol use.  Decrease the amount of caffeine you drink, or stop drinking caffeine.  Eat and sleep on a regular schedule.  Get 7 to 9 hours of sleep, or as recommended by your caregiver.  Keep lights dim if bright  lights bother you and make your headaches worse. SEEK MEDICAL CARE IF:   You have problems with the medicines you were prescribed.  Your medicines are not working.  You have a change from the usual headache.  You have nausea or vomiting. SEEK IMMEDIATE MEDICAL CARE IF:   Your headache becomes severe.  You have a fever.  You have a stiff neck.  You have loss of vision.  You have muscular weakness or loss of muscle control.  You start losing your balance or have trouble walking.  You feel faint or pass out.  You have severe symptoms that are different from your first symptoms. MAKE SURE YOU:   Understand these instructions.  Will watch your condition.  Will get help right away if you are not doing well or get worse. Document Released: 11/08/2005 Document Revised: 01/31/2012 Document Reviewed: 11/24/2011 [facility] Patient Information 2015 [place], [place]. This information is not intended to replace advice given to you by your health care provider. Make sure you discuss any questions you have with your health care provider.

## 2015-08-04 ENCOUNTER — Encounter: Payer: Self-pay | Admitting: Family Medicine

## 2015-08-04 ENCOUNTER — Ambulatory Visit (INDEPENDENT_AMBULATORY_CARE_PROVIDER_SITE_OTHER): Payer: BC Managed Care – PPO | Admitting: Family Medicine

## 2015-08-04 VITALS — BP 122/70 | HR 60 | Ht 66.0 in | Wt 216.4 lb

## 2015-08-04 DIAGNOSIS — Z0000 Encounter for general adult medical examination without abnormal findings: Secondary | ICD-10-CM | POA: Diagnosis not present

## 2015-08-04 DIAGNOSIS — E669 Obesity, unspecified: Secondary | ICD-10-CM | POA: Diagnosis not present

## 2015-08-04 DIAGNOSIS — G44209 Tension-type headache, unspecified, not intractable: Secondary | ICD-10-CM | POA: Diagnosis not present

## 2015-08-04 DIAGNOSIS — R51 Headache: Secondary | ICD-10-CM

## 2015-08-04 DIAGNOSIS — R5383 Other fatigue: Secondary | ICD-10-CM | POA: Diagnosis not present

## 2015-08-04 DIAGNOSIS — R519 Headache, unspecified: Secondary | ICD-10-CM | POA: Insufficient documentation

## 2015-08-04 LAB — POCT URINALYSIS DIPSTICK
Bilirubin, UA: NEGATIVE
Blood, UA: NEGATIVE
GLUCOSE UA: NEGATIVE
Ketones, UA: NEGATIVE
Nitrite, UA: NEGATIVE
Protein, UA: NEGATIVE
SPEC GRAV UA: 1.015
UROBILINOGEN UA: NEGATIVE
pH, UA: 7

## 2015-08-04 NOTE — Patient Instructions (Addendum)
Check with your health insurance and see if they cover and yearly eye exam. Also check and see if they would cover a nutritionist. Let me know and we can set this up after we get your lab results back. If you are having to take headache medication more than 3 times a week let me know. Start eating small frequent meals throughout the day and do not skip meals. Start exercising 30 minutes at least 5 times per week.   Preventative Care for Adults - Female      MAINTAIN REGULAR HEALTH EXAMS:  A routine yearly physical is a good way to check in with your primary care provider about your health and preventive screening. It is also an opportunity to share updates about your health and any concerns you have, and receive a thorough all-over exam.   Most health insurance companies pay for at least some preventative services.  Check with your health plan for specific coverages.  WHAT PREVENTATIVE SERVICES DO WOMEN NEED?  Adult women should have their weight and blood pressure checked regularly.   Women age 34 and older should have their cholesterol levels checked regularly.  Women should be screened for cervical cancer with a Pap smear and pelvic exam beginning at either age 55, or 3 years after they become sexually activity.    Breast cancer screening generally begins at age 65 with a mammogram and breast exam by your primary care provider.    Beginning at age 77 and continuing to age 85, women should be screened for colorectal cancer.  Certain people may need continued testing until age 60.  Updating vaccinations is part of preventative care.  Vaccinations help protect against diseases such as the flu.  Osteoporosis is a disease in which the bones lose minerals and strength as we age. Women ages 34 and over should discuss this with their caregivers, as should women after menopause who have other risk factors.  Lab tests are generally done as part of preventative care to screen for anemia and  blood disorders, to screen for problems with the kidneys and liver, to screen for bladder problems, to check blood sugar, and to check your cholesterol level.  Preventative services generally include counseling about diet, exercise, avoiding tobacco, drugs, excessive alcohol consumption, and sexually transmitted infections.    GENERAL RECOMMENDATIONS FOR GOOD HEALTH:  Healthy diet:  Eat a variety of foods, including fruit, vegetables, animal or vegetable protein, such as meat, fish, chicken, and eggs, or beans, lentils, tofu, and grains, such as rice.  Drink plenty of water daily.  Decrease saturated fat in the diet, avoid lots of red meat, processed foods, sweets, fast foods, and fried foods.  Exercise:  Aerobic exercise helps maintain good heart health. At least 30-40 minutes of moderate-intensity exercise is recommended. For example, a brisk walk that increases your heart rate and breathing. This should be done on most days of the week.   Find a type of exercise or a variety of exercises that you enjoy so that it becomes a part of your daily life.  Examples are running, walking, swimming, water aerobics, and biking.  For motivation and support, explore group exercise such as aerobic class, spin class, Zumba, Yoga,or  martial arts, etc.    Set exercise goals for yourself, such as a certain weight goal, walk or run in a race such as a 5k walk/run.  Speak to your primary care provider about exercise goals.  Disease prevention:  If you smoke or chew tobacco,  find out from your caregiver how to quit. It can literally save your life, no matter how long you have been a tobacco user. If you do not use tobacco, never begin.   Maintain a healthy diet and normal weight. Increased weight leads to problems with blood pressure and diabetes.   The Body Mass Index or BMI is a way of measuring how much of your body is fat. Having a BMI above 27 increases the risk of heart disease, diabetes,  hypertension, stroke and other problems related to obesity. Your caregiver can help determine your BMI and based on it develop an exercise and dietary program to help you achieve or maintain this important measurement at a healthful level.  High blood pressure causes heart and blood vessel problems.  Persistent high blood pressure should be treated with medicine if weight loss and exercise do not work.   Fat and cholesterol leaves deposits in your arteries that can block them. This causes heart disease and vessel disease elsewhere in your body.  If your cholesterol is found to be high, or if you have heart disease or certain other medical conditions, then you may need to have your cholesterol monitored frequently and be treated with medication.   Ask if you should have a cardiac stress test if your history suggests this. A stress test is a test done on a treadmill that looks for heart disease. This test can find disease prior to there being a problem.  Menopause can be associated with physical symptoms and risks. Hormone replacement therapy is available to decrease these. You should talk to your caregiver about whether starting or continuing to take hormones is right for you.   Osteoporosis is a disease in which the bones lose minerals and strength as we age. This can result in serious bone fractures. Risk of osteoporosis can be identified using a bone density scan. Women ages 71 and over should discuss this with their caregivers, as should women after menopause who have other risk factors. Ask your caregiver whether you should be taking a calcium supplement and Vitamin D, to reduce the rate of osteoporosis.   Avoid drinking alcohol in excess (more than two drinks per day).  Avoid use of street drugs. Do not share needles with anyone. Ask for professional help if you need assistance or instructions on stopping the use of alcohol, cigarettes, and/or drugs.  Brush your teeth twice a day with fluoride  toothpaste, and floss once a day. Good oral hygiene prevents tooth decay and gum disease. The problems can be painful, unattractive, and can cause other health problems. Visit your dentist for a routine oral and dental check up and preventive care every 6-12 months.   Look at your skin regularly.  Use a mirror to look at your back. Notify your caregivers of changes in moles, especially if there are changes in shapes, colors, a size larger than a pencil eraser, an irregular border, or development of new moles.  Safety:  Use seatbelts 100% of the time, whether driving or as a passenger.  Use safety devices such as hearing protection if you work in environments with loud noise or significant background noise.  Use safety glasses when doing any work that could send debris in to the eyes.  Use a helmet if you ride a bike or motorcycle.  Use appropriate safety gear for contact sports.  Talk to your caregiver about gun safety.  Use sunscreen with a SPF (or skin protection factor) of 15 or greater.  Lighter skinned people are at a greater risk of skin cancer. Don't forget to also wear sunglasses in order to protect your eyes from too much damaging sunlight. Damaging sunlight can accelerate cataract formation.   Practice safe sex. Use condoms. Condoms are used for birth control and to help reduce the spread of sexually transmitted infections (or STIs).  Some of the STIs are gonorrhea (the clap), chlamydia, syphilis, trichomonas, herpes, HPV (human papilloma virus) and HIV (human immunodeficiency virus) which causes AIDS. The herpes, HIV and HPV are viral illnesses that have no cure. These can result in disability, cancer and death.   Keep carbon monoxide and smoke detectors in your home functioning at all times. Change the batteries every 6 months or use a model that plugs into the wall.   Vaccinations:  Stay up to date with your tetanus shots and other required immunizations. You should have a booster for  tetanus every 10 years. Be sure to get your flu shot every year, since 5%-20% of the [place] population comes down with the flu. The flu vaccine changes each year, so being vaccinated once is not enough. Get your shot in the fall, before the flu season peaks.   Other vaccines to consider:  Human Papilloma Virus or HPV causes cancer of the cervix, and other infections that can be transmitted from person to person. There is a vaccine for HPV, and females should get immunized between the ages of 11 and 52. It requires a series of 3 shots.   Pneumococcal vaccine to protect against certain types of pneumonia.  This is normally recommended for adults age 41 or older.  However, adults younger than 31 years old with certain underlying conditions such as diabetes, heart or lung disease should also receive the vaccine.  Shingles vaccine to protect against Varicella Zoster if you are older than age 18, or younger than 31 years old with certain underlying illness.  Hepatitis A vaccine to protect against a form of infection of the liver by a virus acquired from food.  Hepatitis B vaccine to protect against a form of infection of the liver by a virus acquired from blood or body fluids, particularly if you work in health care.  If you plan to travel internationally, check with your local health department for specific vaccination recommendations.  Cancer Screening:  Breast cancer screening is essential to preventive care for women. All women age 94 and older should perform a breast self-exam every month. At age 85 and older, women should have their caregiver complete a breast exam each year. Women at ages 43 and older should have a mammogram (x-ray film) of the breasts. Your caregiver can discuss how often you need mammograms.    Cervical cancer screening includes taking a Pap smear (sample of cells examined under a microscope) from the cervix (end of the uterus). It also includes testing for HPV (Human Papilloma  Virus, which can cause cervical cancer). Screening and a pelvic exam should begin at age 31, or 3 years after a woman becomes sexually active. Screening should occur every year, with a Pap smear but no HPV testing, up to age 65. After age 12, you should have a Pap smear every 3 years with HPV testing, if no HPV was found previously.   Most routine colon cancer screening begins at the age of 54. On a yearly basis, doctors may provide special easy to use take-home tests to check for hidden blood in the stool. Sigmoidoscopy or colonoscopy can detect the  earliest forms of colon cancer and is life saving. These tests use a small camera at the end of a tube to directly examine the colon. Speak to your caregiver about this at age 61, when routine screening begins (and is repeated every 5 years unless early forms of pre-cancerous polyps or small growths are found).

## 2015-08-04 NOTE — Progress Notes (Signed)
Subjective:    Patient ID: Roberta Galvan, female    DOB: 1986/02/28, 29 y.o.   MRN: [id number]  HPI Chief Complaint  Patient presents with  . cpe    cpe non fasting. declines flu shot. had pap done already. weight gain     She is here for complete physical exam. States her headaches have improved since her last visit. Has taken ibuprofen 2 times in past week. Denies headache today. She reports a significant weight increase over the past few months. She does admit to skipping meals and states she never eats breakfast. She reports eating what she considers a normal amount of food for lunch and supper. She reports that she does not exercise but has done this in past. She states she occasionally feels more tired than usual and is not sure why. Also reports hair being seems to be breaking off and complains of dry skin on her face, worse than usual.   Reports regular menses. No change in bowel habits. Denies dysuria, urinary frequency, back pain.   Other providers: OB/GYN at [facility] Social: denies smoking, drinking or drug use. She is married and has 3 kids, 17 months, 20 yo, 31 yo Works at [employer].   Immunizations: refused flu shot, had Tetanus shot 5 years ago  Health maintenance: Pap smear last year and normal at OB/GYN   Reviewed allergies, medications, past medical, surgical, social, family history.     Review of Systems Review of Systems Constitutional: -fever, -chills, -sweats, +unexpected weight gain,+ mild fatigue Integumentary: +dry skin ENT: -runny nose, -ear pain, -sore throat Cardiology:  -chest pain, -palpitations, -edema Respiratory: -cough, -shortness of breath, -wheezing Gastroenterology: -abdominal pain, -nausea, -vomiting, -diarrhea, -constipation  Hematology: -bleeding or bruising problems Musculoskeletal: -arthralgias, -myalgias, -joint swelling, -back pain Ophthalmology: -vision changes Urology: -dysuria, -difficulty urinating, -hematuria, -urinary  frequency, -urgency Neurology: -headache, -weakness, -tingling, -numbness         Objective:   Physical Exam  BP 122/70 mmHg  Pulse 60  Ht  (1.676 m)  Wt 216 lb 6.4 oz (98.158 kg)  BMI 34.94 kg/m2  LMP 07/07/2015  General Appearance:    Alert, cooperative, no distress, appears stated age  Head:    Normocephalic, without obvious abnormality, atraumatic  Eyes:    PERRL, conjunctiva/corneas clear, EOM's intact, fundi    benign  Ears:    Normal TM's and external ear canals  Nose:   Nares normal, mucosa normal, no drainage or sinus   tenderness  Throat:   Lips, mucosa, and tongue normal; teeth and gums normal  Neck:   Supple, no lymphadenopathy;  thyroid:  no   enlargement/tenderness/nodules  Back:    Spine nontender, no curvature, ROM normal, no CVA     tenderness  Lungs:     Clear to auscultation bilaterally without wheezes, rales or     ronchi; respirations unlabored  Chest Wall:    No tenderness or deformity   Heart:    Regular rate and rhythm, S1 and S2 normal, no murmur, rub   or gallop  Breast Exam:    No tenderness, masses, or nipple discharge or inversion.      No axillary lymphadenopathy  Abdomen:     Soft, non-tender, nondistended, normoactive bowel sounds,    no masses, no hepatosplenomegaly  Genitalia:    Refused, states she sees OB/GYN  Rectal:    Not performed due to age<40 and no related complaints  Extremities:   No clubbing, cyanosis or edema  Pulses:  2+ and symmetric all extremities  Skin:   Skin color, texture, turgor normal, no rashes or lesions  Lymph nodes:   Cervical, supraclavicular, and axillary nodes normal  Neurologic:   CNII-XII intact, normal strength, sensation and gait; reflexes 1+ and symmetric throughout          Psych:   Normal mood, affect, hygiene and grooming.     Urinalysis dipstick 1+ leukocytes, she is asymptomatic     Assessment & Plan:  Routine general medical examination at a health care facility - Plan: POCT urinalysis  dipstick, CBC with Differential/Platelet, Comprehensive metabolic panel, Lipid panel  Tension-type headache, not intractable, unspecified chronicity pattern  Obesity (BMI 30-39.9) - Plan: Lipid panel, TSH  Other fatigue - Plan: TSH, Vitamin D, 25-hydroxy   Discussed in depth that she should not skip meals and should eat small frequent meals. Also recommended that she start exercising at least 150 minutes per week, 30 minutes 5 times a week. Discussed that we will check her labs and look for any abnormalities be causing her weight gain and fatigue. Discussed consequences of obesity including diabetes, heart disease, etc. Will refer to nutritionist after we get lab results and as long as her insurance will pays for this. She is going to call her insurance and see about this. She is also going to check with her insurance about covering an eye exam. She states she wears reading glasses and would like to have her eyes checked. Discussed that I do not want her taking ibuprofen more than a couple of times per week for headaches. She will let me know if this is happening more often and we will take a different approach. She will return when fasting for blood work. Refused flu shot.

## 2015-08-05 ENCOUNTER — Other Ambulatory Visit: Payer: BC Managed Care – PPO

## 2015-08-05 DIAGNOSIS — R5383 Other fatigue: Secondary | ICD-10-CM

## 2015-08-05 DIAGNOSIS — E669 Obesity, unspecified: Secondary | ICD-10-CM

## 2015-08-05 DIAGNOSIS — Z0000 Encounter for general adult medical examination without abnormal findings: Secondary | ICD-10-CM

## 2015-08-05 LAB — CBC WITH DIFFERENTIAL/PLATELET
BASOS ABS: 0 10*3/uL (ref 0.0–0.1)
Basophils Relative: 0 % (ref 0–1)
Eosinophils Absolute: 0.1 10*3/uL (ref 0.0–0.7)
Eosinophils Relative: 2 % (ref 0–5)
HEMATOCRIT: 33.7 % — AB (ref 36.0–46.0)
HEMOGLOBIN: 11 g/dL — AB (ref 12.0–15.0)
LYMPHS PCT: 53 % — AB (ref 12–46)
Lymphs Abs: 2.1 10*3/uL (ref 0.7–4.0)
MCH: 29 pg (ref 26.0–34.0)
MCHC: 32.6 g/dL (ref 30.0–36.0)
MCV: 88.9 fL (ref 78.0–100.0)
MONO ABS: 0.4 10*3/uL (ref 0.1–1.0)
MPV: 12.1 fL (ref 8.6–12.4)
Monocytes Relative: 10 % (ref 3–12)
NEUTROS ABS: 1.4 10*3/uL — AB (ref 1.7–7.7)
Neutrophils Relative %: 35 % — ABNORMAL LOW (ref 43–77)
Platelets: 143 10*3/uL — ABNORMAL LOW (ref 150–400)
RBC: 3.79 MIL/uL — AB (ref 3.87–5.11)
RDW: 14.5 % (ref 11.5–15.5)
WBC: 3.9 10*3/uL — AB (ref 4.0–10.5)

## 2015-08-05 LAB — COMPREHENSIVE METABOLIC PANEL
ALBUMIN: 3.9 g/dL (ref 3.6–5.1)
ALK PHOS: 50 U/L (ref 33–115)
ALT: 15 U/L (ref 6–29)
AST: 19 U/L (ref 10–30)
BILIRUBIN TOTAL: 0.5 mg/dL (ref 0.2–1.2)
BUN: 14 mg/dL (ref 7–25)
CALCIUM: 8.8 mg/dL (ref 8.6–10.2)
CO2: 23 mmol/L (ref 20–31)
Chloride: 104 mmol/L (ref 98–110)
Creat: 0.71 mg/dL (ref 0.50–1.10)
Glucose, Bld: 79 mg/dL (ref 65–99)
Potassium: 4.1 mmol/L (ref 3.5–5.3)
Sodium: 139 mmol/L (ref 135–146)
Total Protein: 6.8 g/dL (ref 6.1–8.1)

## 2015-08-05 LAB — LIPID PANEL
Cholesterol: 122 mg/dL — ABNORMAL LOW (ref 125–200)
HDL: 41 mg/dL — AB (ref >=46)
LDL Cholesterol: 73 mg/dL (ref <130)
TRIGLYCERIDES: 38 mg/dL (ref <150)
Total CHOL/HDL Ratio: 3 Ratio (ref <=5.0)
VLDL: 8 mg/dL (ref <30)

## 2015-08-05 LAB — TSH: TSH: 1.434 u[IU]/mL (ref 0.350–4.500)

## 2015-08-06 LAB — VITAMIN D 25 HYDROXY (VIT D DEFICIENCY, FRACTURES): Vit D, 25-Hydroxy: 17 ng/mL — ABNORMAL LOW (ref 30–100)

## 2015-08-11 ENCOUNTER — Other Ambulatory Visit: Payer: Self-pay | Admitting: Family Medicine

## 2015-08-11 MED ORDER — VITAMIN D (ERGOCALCIFEROL) 1.25 MG (50000 UNIT) PO CAPS: 50000.0000 [IU] | ORAL_CAPSULE | ORAL | Status: DC

## 2015-08-11 NOTE — Progress Notes (Signed)
i have tried to call pt but phone is not accepting incoming calls

## 2015-08-13 NOTE — Progress Notes (Signed)
Pt was notified to pick up rx at pharmacy and then take OTC multivitamin with iron

## 2015-11-01 ENCOUNTER — Other Ambulatory Visit: Payer: Self-pay | Admitting: Family Medicine

## 2015-11-26 ENCOUNTER — Encounter: Payer: Self-pay | Admitting: Family Medicine

## 2015-11-26 ENCOUNTER — Ambulatory Visit (INDEPENDENT_AMBULATORY_CARE_PROVIDER_SITE_OTHER): Payer: BC Managed Care – PPO | Admitting: Family Medicine

## 2015-11-26 VITALS — BP 122/76 | HR 60 | Wt 209.6 lb

## 2015-11-26 DIAGNOSIS — R208 Other disturbances of skin sensation: Secondary | ICD-10-CM

## 2015-11-26 DIAGNOSIS — G5603 Carpal tunnel syndrome, bilateral upper limbs: Secondary | ICD-10-CM

## 2015-11-26 DIAGNOSIS — R200 Anesthesia of skin: Secondary | ICD-10-CM

## 2015-11-26 NOTE — Patient Instructions (Signed)
Take 2 aleve twice daily with food for next 2 weeks. Try wearing arm/wrist splints that you can pick up at a pharmacy. If they are too expensive let me know and I can write prescriptions for them if your insurance company covers them.  Stay well hydrated and continue with weight loss.  Avoid sleeping with arms bent or putting your elbows on the table or putting weight on elbows.  Return in 2 weeks for follow up.   Carpal Tunnel Syndrome Carpal tunnel syndrome is a condition that causes pain in your hand and arm. The carpal tunnel is a narrow area located on the palm side of your wrist. Repeated wrist motion or certain diseases may cause swelling within the tunnel. This swelling pinches the main nerve in the wrist (median nerve). CAUSES  This condition may be caused by:   Repeated wrist motions.  Wrist injuries.  Arthritis.  A cyst or tumor in the carpal tunnel.  Fluid buildup during pregnancy. Sometimes the cause of this condition is not known.  RISK FACTORS This condition is more likely to develop in:   People who have jobs that cause them to repeatedly move their wrists in the same motion, such as butchers and cashiers.  Women.  People with certain conditions, such as:  Diabetes.  Obesity.  An underactive thyroid (hypothyroidism).  Kidney failure. SYMPTOMS  Symptoms of this condition include:   A tingling feeling in your fingers, especially in your thumb, index, and middle fingers.  Tingling or numbness in your hand.  An aching feeling in your entire arm, especially when your wrist and elbow are bent for long periods of time.  Wrist pain that goes up your arm to your shoulder.  Pain that goes down into your palm or fingers.  A weak feeling in your hands. You may have trouble grabbing and holding items. Your symptoms may feel worse during the night.  DIAGNOSIS  This condition is diagnosed with a medical history and physical exam. You may also have tests,  including:   An electromyogram (EMG). This test measures electrical signals sent by your nerves into the muscles.  X-rays. TREATMENT  Treatment for this condition includes:  Lifestyle changes. It is important to stop doing or modify the activity that caused your condition.  Physical or occupational therapy.  Medicines for pain and inflammation. This may include medicine that is injected into your wrist.  A wrist splint.  Surgery. HOME CARE INSTRUCTIONS  If You Have a Splint:  Wear it as told by your health care provider. Remove it only as told by your health care provider.  Loosen the splint if your fingers become numb and tingle, or if they turn cold and blue.  Keep the splint clean and dry. General Instructions  Take over-the-counter and prescription medicines only as told by your health care provider.  Rest your wrist from any activity that may be causing your pain. If your condition is work related, talk to your employer about changes that can be made, such as getting a wrist pad to use while typing.  If directed, apply ice to the painful area:  Put ice in a plastic bag.  Place a towel between your skin and the bag.  Leave the ice on for 20 minutes, 2-3 times per day.  Keep all follow-up visits as told by your health care provider. This is important.  Do any exercises as told by your health care provider, physical therapist, or occupational therapist. SEEK MEDICAL CARE IF:  You have new symptoms.  Your pain is not controlled with medicines.  Your symptoms get worse.   This information is not intended to replace advice given to you by your health care provider. Make sure you discuss any questions you have with your health care provider.   Document Released: 11/05/2000 Document Revised: 07/30/2015 Document Reviewed: 03/26/2015 Elsevier Interactive Patient Education [employer]2016 [employer] Inc.
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
## 2015-12-05 ENCOUNTER — Ambulatory Visit: Payer: BC Managed Care – PPO | Admitting: Family Medicine

## 2015-12-23 ENCOUNTER — Encounter: Payer: Self-pay | Admitting: Family Medicine

## 2015-12-23 ENCOUNTER — Ambulatory Visit (INDEPENDENT_AMBULATORY_CARE_PROVIDER_SITE_OTHER): Payer: BC Managed Care – PPO | Admitting: Family Medicine

## 2015-12-23 VITALS — BP 112/62 | HR 60 | Wt 204.2 lb

## 2015-12-23 DIAGNOSIS — G5603 Carpal tunnel syndrome, bilateral upper limbs: Secondary | ICD-10-CM | POA: Diagnosis not present

## 2015-12-23 NOTE — Progress Notes (Signed)
   Subjective:    Patient ID: Roberta Galvan, female    DOB: 11/25/1985, 30 y.o.   MRN: [id number]  HPI Chief Complaint  Patient presents with  . carpal tunnell    carpal tunnel. doing much better   She is here for follow-up bilateral wrist pain and finger numbness. She reports significant improvement and has been taking Ibuprofen as needed. She states she has not been wearing wrist splints and does not need these. She states she does not want further treatment at this time and is good with how things are currently.    Review of Systems Pertinent positives and negatives in the history of present illness.     Objective:   Physical Exam BP 112/62 mmHg  Pulse 60  Wt 204 lb 3.2 oz (92.625 kg)  Alert and oriented and in no acute distress.       Assessment & Plan:  Carpal tunnel syndrome, bilateral  She appears to be better and no further treatment needed at this time. Will follow up if this condition worsens.
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
## 2016-09-15 ENCOUNTER — Encounter: Payer: Self-pay | Admitting: Family Medicine

## 2016-09-15 ENCOUNTER — Ambulatory Visit (INDEPENDENT_AMBULATORY_CARE_PROVIDER_SITE_OTHER): Payer: BC Managed Care – PPO | Admitting: Family Medicine

## 2016-09-15 ENCOUNTER — Other Ambulatory Visit (HOSPITAL_COMMUNITY)
Admission: RE | Admit: 2016-09-15 | Discharge: 2016-09-15 | Disposition: A | Discharge status: Home / Self Care | Payer: BC Managed Care – PPO | Source: Ambulatory Visit | Attending: Family Medicine | Admitting: Family Medicine

## 2016-09-15 VITALS — BP 120/70 | HR 63 | Ht 66.25 in | Wt 214.6 lb

## 2016-09-15 DIAGNOSIS — Z1151 Encounter for screening for human papillomavirus (HPV): Secondary | ICD-10-CM | POA: Insufficient documentation

## 2016-09-15 DIAGNOSIS — E559 Vitamin D deficiency, unspecified: Secondary | ICD-10-CM | POA: Diagnosis not present

## 2016-09-15 DIAGNOSIS — E66811 Obesity, class 1: Secondary | ICD-10-CM

## 2016-09-15 DIAGNOSIS — Z113 Encounter for screening for infections with a predominantly sexual mode of transmission: Secondary | ICD-10-CM | POA: Diagnosis not present

## 2016-09-15 DIAGNOSIS — E669 Obesity, unspecified: Secondary | ICD-10-CM

## 2016-09-15 DIAGNOSIS — Z0000 Encounter for general adult medical examination without abnormal findings: Secondary | ICD-10-CM | POA: Diagnosis not present

## 2016-09-15 DIAGNOSIS — Z01419 Encounter for gynecological examination (general) (routine) without abnormal findings: Secondary | ICD-10-CM | POA: Insufficient documentation

## 2016-09-15 DIAGNOSIS — Z124 Encounter for screening for malignant neoplasm of cervix: Secondary | ICD-10-CM

## 2016-09-15 DIAGNOSIS — D509 Iron deficiency anemia, unspecified: Secondary | ICD-10-CM | POA: Diagnosis not present

## 2016-09-15 DIAGNOSIS — R5383 Other fatigue: Secondary | ICD-10-CM | POA: Diagnosis not present

## 2016-09-15 LAB — CBC WITH DIFFERENTIAL/PLATELET
BASOS PCT: 0 %
Basophils Absolute: 0 cells/uL (ref 0–200)
EOS ABS: 78 {cells}/uL (ref 15–500)
EOS PCT: 2 %
HCT: 36 % (ref 35.0–45.0)
Hemoglobin: 11.7 g/dL (ref 11.7–15.5)
Lymphocytes Relative: 53 %
Lymphs Abs: 2067 cells/uL (ref 850–3900)
MCH: 30.7 pg (ref 27.0–33.0)
MCHC: 32.5 g/dL (ref 32.0–36.0)
MCV: 94.5 fL (ref 80.0–100.0)
MONOS PCT: 11 %
MPV: 12 fL (ref 7.5–12.5)
Monocytes Absolute: 429 cells/uL (ref 200–950)
NEUTROS ABS: 1326 {cells}/uL — AB (ref 1500–7800)
Neutrophils Relative %: 34 %
Platelets: 161 10*3/uL (ref 140–400)
RBC: 3.81 MIL/uL (ref 3.80–5.10)
RDW: 13.4 % (ref 11.0–15.0)
WBC: 3.9 10*3/uL — AB (ref 4.0–10.5)

## 2016-09-15 LAB — POCT URINALYSIS DIPSTICK
BILIRUBIN UA: NEGATIVE
Glucose, UA: NEGATIVE
KETONES UA: NEGATIVE
LEUKOCYTES UA: NEGATIVE
Nitrite, UA: NEGATIVE
PROTEIN UA: NEGATIVE
Spec Grav, UA: >=1.03
Urobilinogen, UA: NEGATIVE
pH, UA: 6

## 2016-09-15 LAB — COMPREHENSIVE METABOLIC PANEL
ALK PHOS: 43 U/L (ref 33–115)
ALT: 19 U/L (ref 6–29)
AST: 19 U/L (ref 10–30)
Albumin: 3.9 g/dL (ref 3.6–5.1)
BUN: 12 mg/dL (ref 7–25)
CO2: 26 mmol/L (ref 20–31)
CREATININE: 0.75 mg/dL (ref 0.50–1.10)
Calcium: 9.1 mg/dL (ref 8.6–10.2)
Chloride: 105 mmol/L (ref 98–110)
Glucose, Bld: 78 mg/dL (ref 65–99)
Potassium: 4.1 mmol/L (ref 3.5–5.3)
SODIUM: 138 mmol/L (ref 135–146)
TOTAL PROTEIN: 6.8 g/dL (ref 6.1–8.1)
Total Bilirubin: 0.5 mg/dL (ref 0.2–1.2)

## 2016-09-15 LAB — TSH: TSH: 1.34 m[IU]/L

## 2016-09-15 NOTE — Progress Notes (Signed)
Subjective:    Patient ID: Roberta Galvan, female    DOB: Oct 12, 1986, 30 y.o.   MRN: [id number]  HPI Chief Complaint  Patient presents with  . cpe    cpe last pap was 3 years ago   She is here for a complete physical exam. She is taking daily iron and vitamin D. Has a history of anemia and vitamin D deficiency.  Complains of being tired and having a physically draining job. States her energy level is fine on the weekends and on her off days. Reports having felt the same level of fatigue for over a year without worsening. States she had more energy when she was exercising however she stopped being physically active several months ago. She also reports weight gain.  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  Denies smoking, drinking alcohol, drug use  Diet: she cooks at home and reports healthy diet. Rice and vegetables.  Excerise: stopped exercising in may 2017. She had more energy   Immunizations: refused flu shot. Tdap 2015  Health maintenance:  Mammogram: N/A Colonoscopy: N/A Last Gynecological Exam: 2 years ago.  Last Menstrual cycle: 09/08/2016 Pregnancies: 3 Last Dental Exam: twice annually Last Eye Exam: has appointment for this  Wears seatbelt always, uses sunscreen, smoke detectors in home and functioning, does not text while driving and feels safe in home environment.   Reviewed allergies, medications, past medical, surgical, family, and social history.   Review of Systems Pertinent positives and negatives in the history of present illness.     Objective:   Physical Exam BP 120/70   Pulse 63   Ht 5' 6.25" (1.683 m)   Wt 214 lb 9.6 oz (97.3 kg)   LMP 09/08/2016   BMI 34.38 kg/m   General Appearance:    Alert, cooperative, no distress, appears stated age  Head:    Normocephalic, without obvious abnormality, atraumatic  Eyes:    PERRL, conjunctiva/corneas clear, EOM's intact, fundi   benign  Ears:    Normal TM's and external ear canals  Nose:   Nares normal, mucosa normal, no drainage or sinus   tenderness  Throat:   Lips, mucosa, and tongue normal; teeth and gums normal  Neck:   Supple, no lymphadenopathy;  thyroid:  no   enlargement/tenderness/nodules; no carotid   bruit or JVD  Back:    Spine nontender, no curvature, ROM normal, no CVA     tenderness  Lungs:     Clear to auscultation bilaterally without wheezes, rales or     ronchi; respirations unlabored  Chest Wall:    No tenderness or deformity   Heart:    Regular rate and rhythm, S1 and S2 normal, no murmur, rub   or gallop  Breast Exam:    No tenderness, masses, or nipple discharge or inversion.      No axillary lymphadenopathy  Abdomen:     Soft, non-tender, nondistended, normoactive bowel sounds,    no masses, no hepatosplenomegaly  Genitalia:    Normal external genitalia without lesions.  BUS and vagina normal; cervix without lesions, or cervical motion tenderness. No abnormal vaginal discharge.  Uterus and adnexa not enlarged, nontender, no masses.  Pap performed. Chaperone present.   Rectal:    Not performed due to age<40 and no related complaints  Extremities:   No clubbing, cyanosis or edema  Pulses:   2+ and symmetric all extremities  Skin:  Skin color, texture, turgor normal, no rashes or lesions  Lymph nodes:   Cervical, supraclavicular, and axillary nodes normal  Neurologic:   CNII-XII intact, normal strength, sensation and gait; reflexes 2+ and symmetric throughout          Psych:   Normal mood, affect, hygiene and grooming.    Urinalysis dipstick: blood 1+ (menses)      Assessment & Plan:  Routine general medical examination at a health care facility - Plan: CBC with Differential/Platelet, Comprehensive metabolic panel, POCT urinalysis dipstick  Screening for cervical cancer - Plan: Cytology - PAP  Screening for STD (sexually transmitted disease) - Plan: RPR, HIV antibody  Obesity (BMI  30.0-34.9) - Plan: TSH  Vitamin D deficiency - Plan: VITAMIN D 25 Hydroxy (Vit-D Deficiency, Fractures)  Iron deficiency anemia, unspecified iron deficiency anemia type - Plan: CBC with Differential/Platelet  Fatigue, unspecified type - Plan: TSH  Suspect that her fatigue is related to 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 She reports having more energy on the weekends. Will check labs to look for underlying etiology. She is also eating only one meal per day. Discussed that this may be contributing to her fatigue and slowing her metabolism. Recommend she start eating small frequent meals throughout the day. She has had a 10 lb weight gain since January. Encouraged her to start exercising again. She notes that she had more energy while she was exercising. She does not know what dose of vitamin D over-the-counter she is currently taking. We'll check labs and determine an appropriate dose for her. Plan to check CBC for history of anemia. Suspect this is related to her monthly cycles. Pap smear done.  Refuses flu shot. Up-to-date on immunizations otherwise. Follow up pending labs or in 1 year.

## 2016-09-15 NOTE — Patient Instructions (Addendum)
Try to start eating small frequent meals throughout the day as opposed to one large meal in the evening. Make sure you're exercising, at least 150 minutes of some sort of physical activity per week. If you decide to get a flu shot you can let us know. We will call you with lab results.  Preventative Care for Adults - Female      MAINTAIN REGULAR HEALTH EXAMS:  A routine yearly physical is a good way to check in with your primary care provider about your health and preventive screening. It is also an opportunity to share updates about your health and any concerns you have, and receive a thorough all-over exam.   Most health insurance companies pay for at least some preventative services.  Check with your health plan for specific coverages.  WHAT PREVENTATIVE SERVICES DO WOMEN NEED?  Adult women should have their weight and blood pressure checked regularly.   Women age 32 and older should have their cholesterol levels checked regularly.  Women should be screened for cervical cancer with a Pap smear and pelvic exam beginning at either age 32, or 3 years after they become sexually activity.    Breast cancer screening generally begins at age 32 with a mammogram and breast exam by your primary care provider.    Beginning at age 80 and continuing to age 47, women should be screened for colorectal cancer.  Certain people may need continued testing until age 19.  Updating vaccinations is part of preventative care.  Vaccinations help protect against diseases such as the flu.  Osteoporosis is a disease in which the bones lose minerals and strength as we age. Women ages 21 and over should discuss this with their caregivers, as should women after menopause who have other risk factors.  Lab tests are generally done as part of preventative care to screen for anemia and blood disorders, to screen for problems with the kidneys and liver, to screen for bladder problems, to check blood sugar, and to check  your cholesterol level.  Preventative services generally include counseling about diet, exercise, avoiding tobacco, drugs, excessive alcohol consumption, and sexually transmitted infections.    GENERAL RECOMMENDATIONS FOR GOOD HEALTH:  Healthy diet:  Eat a variety of foods, including fruit, vegetables, animal or vegetable protein, such as meat, fish, chicken, and eggs, or beans, lentils, tofu, and grains, such as rice.  Drink plenty of water daily.  Decrease saturated fat in the diet, avoid lots of red meat, processed foods, sweets, fast foods, and fried foods.  Exercise:  Aerobic exercise helps maintain good heart health. At least 30-40 minutes of moderate-intensity exercise is recommended. For example, a brisk walk that increases your heart rate and breathing. This should be done on most days of the week.   Find a type of exercise or a variety of exercises that you enjoy so that it becomes a part of your daily life.  Examples are running, walking, swimming, water aerobics, and biking.  For motivation and support, explore group exercise such as aerobic class, spin class, Zumba, Yoga,or  martial arts, etc.    Set exercise goals for yourself, such as a certain weight goal, walk or run in a race such as a 5k walk/run.  Speak to your primary care provider about exercise goals.  Disease prevention:  If you smoke or chew tobacco, find out from your caregiver how to quit. It can literally save your life, no matter how long you have been a tobacco user. If you  do not use tobacco, never begin.   Maintain a healthy diet and normal weight. Increased weight leads to problems with blood pressure and diabetes.   The Body Mass Index or BMI is a way of measuring how much of your body is fat. Having a BMI above 27 increases the risk of heart disease, diabetes, hypertension, stroke and other problems related to obesity. Your caregiver can help determine your BMI and based on it develop an exercise and  dietary program to help you achieve or maintain this important measurement at a healthful level.  High blood pressure causes heart and blood vessel problems.  Persistent high blood pressure should be treated with medicine if weight loss and exercise do not work.   Fat and cholesterol leaves deposits in your arteries that can block them. This causes heart disease and vessel disease elsewhere in your body.  If your cholesterol is found to be high, or if you have heart disease or certain other medical conditions, then you may need to have your cholesterol monitored frequently and be treated with medication.   Ask if you should have a cardiac stress test if your history suggests this. A stress test is a test done on a treadmill that looks for heart disease. This test can find disease prior to there being a problem.  Menopause can be associated with physical symptoms and risks. Hormone replacement therapy is available to decrease these. You should talk to your caregiver about whether starting or continuing to take hormones is right for you.   Osteoporosis is a disease in which the bones lose minerals and strength as we age. This can result in serious bone fractures. Risk of osteoporosis can be identified using a bone density scan. Women ages 1365 and over should discuss this with their caregivers, as should women after menopause who have other risk factors. Ask your caregiver whether you should be taking a calcium supplement and Vitamin D, to reduce the rate of osteoporosis.   Avoid drinking alcohol in excess (more than two drinks per day).  Avoid use of street drugs. Do not share needles with anyone. Ask for professional help if you need assistance or instructions on stopping the use of alcohol, cigarettes, and/or drugs.  Brush your teeth twice a day with fluoride toothpaste, and floss once a day. Good oral hygiene prevents tooth decay and gum disease. The problems can be painful, unattractive, and can cause  other health problems. Visit your dentist for a routine oral and dental check up and preventive care every 6-12 months.   Look at your skin regularly.  Use a mirror to look at your back. Notify your caregivers of changes in moles, especially if there are changes in shapes, colors, a size larger than a pencil eraser, an irregular border, or development of new moles.  Safety:  Use seatbelts 100% of the time, whether driving or as a passenger.  Use safety devices such as hearing protection if you work in environments with loud noise or significant background noise.  Use safety glasses when doing any work that could send debris in to the eyes.  Use a helmet if you ride a bike or motorcycle.  Use appropriate safety gear for contact sports.  Talk to your caregiver about gun safety.  Use sunscreen with a SPF (or skin protection factor) of 15 or greater.  Lighter skinned people are at a greater risk of skin cancer. Don't forget to also wear sunglasses in order to protect your eyes from too  much damaging sunlight. Damaging sunlight can accelerate cataract formation.   Practice safe sex. Use condoms. Condoms are used for birth control and to help reduce the spread of sexually transmitted infections (or STIs).  Some of the STIs are gonorrhea (the clap), chlamydia, syphilis, trichomonas, herpes, HPV (human papilloma virus) and HIV (human immunodeficiency virus) which causes AIDS. The herpes, HIV and HPV are viral illnesses that have no cure. These can result in disability, cancer and death.   Keep carbon monoxide and smoke detectors in your home functioning at all times. Change the batteries every 6 months or use a model that plugs into the wall.   Vaccinations:  Stay up to date with your tetanus shots and other required immunizations. You should have a booster for tetanus every 10 years. Be sure to get your flu shot every year, since 5%-20% of the [place] population comes down with the flu. The flu vaccine changes  each year, so being vaccinated once is not enough. Get your shot in the fall, before the flu season peaks.   Other vaccines to consider:  Human Papilloma Virus or HPV causes cancer of the cervix, and other infections that can be transmitted from person to person. There is a vaccine for HPV, and females should get immunized between the ages of 38 and 93. It requires a series of 3 shots.   Pneumococcal vaccine to protect against certain types of pneumonia.  This is normally recommended for adults age 28 or older.  However, adults younger than 32 years old with certain underlying conditions such as diabetes, heart or lung disease should also receive the vaccine.  Shingles vaccine to protect against Varicella Zoster if you are older than age 35, or younger than 32 years old with certain underlying illness.  Hepatitis A vaccine to protect against a form of infection of the liver by a virus acquired from food.  Hepatitis B vaccine to protect against a form of infection of the liver by a virus acquired from blood or body fluids, particularly if you work in health care.  If you plan to travel internationally, check with your local health department for specific vaccination recommendations.  Cancer Screening:  Breast cancer screening is essential to preventive care for women. All women age 47 and older should perform a breast self-exam every month. At age 5 and older, women should have their caregiver complete a breast exam each year. Women at ages 5 and older should have a mammogram (x-ray film) of the breasts. Your caregiver can discuss how often you need mammograms.    Cervical cancer screening includes taking a Pap smear (sample of cells examined under a microscope) from the cervix (end of the uterus). It also includes testing for HPV (Human Papilloma Virus, which can cause cervical cancer). Screening and a pelvic exam should begin at age 17, or 3 years after a woman becomes sexually active.  Screening should occur every year, with a Pap smear but no HPV testing, up to age 78. After age 38, you should have a Pap smear every 3 years with HPV testing, if no HPV was found previously.   Most routine colon cancer screening begins at the age of 85. On a yearly basis, doctors may provide special easy to use take-home tests to check for hidden blood in the stool. Sigmoidoscopy or colonoscopy can detect the earliest forms of colon cancer and is life saving. These tests use a small camera at the end of a tube to directly examine the  colon. Speak to your caregiver about this at age 55, when routine screening begins (and is repeated every 5 years unless early forms of pre-cancerous polyps or small growths are found).

## 2016-09-16 ENCOUNTER — Other Ambulatory Visit: Payer: Self-pay | Admitting: Internal Medicine

## 2016-09-16 DIAGNOSIS — E559 Vitamin D deficiency, unspecified: Secondary | ICD-10-CM

## 2016-09-16 LAB — RPR

## 2016-09-16 LAB — VITAMIN D 25 HYDROXY (VIT D DEFICIENCY, FRACTURES): Vit D, 25-Hydroxy: 24 ng/mL — ABNORMAL LOW (ref 30–100)

## 2016-09-16 LAB — HIV ANTIBODY (ROUTINE TESTING W REFLEX): HIV 1&2 Ab, 4th Generation: NONREACTIVE

## 2016-09-16 MED ORDER — VITAMIN D (ERGOCALCIFEROL) 1.25 MG (50000 UNIT) PO CAPS: 50000.0000 [IU] | ORAL_CAPSULE | ORAL | 0 refills | Status: DC

## 2016-09-17 LAB — CYTOLOGY - PAP
CHLAMYDIA, DNA PROBE: NEGATIVE
DIAGNOSIS: NEGATIVE
HPV: NOT DETECTED
Neisseria Gonorrhea: NEGATIVE

## 2016-11-13 ENCOUNTER — Other Ambulatory Visit: Payer: Self-pay | Admitting: Family Medicine

## 2017-04-11 ENCOUNTER — Ambulatory Visit (INDEPENDENT_AMBULATORY_CARE_PROVIDER_SITE_OTHER): Payer: BC Managed Care – PPO | Admitting: Family Medicine

## 2017-04-11 ENCOUNTER — Encounter: Payer: Self-pay | Admitting: Family Medicine

## 2017-04-11 VITALS — BP 120/80 | HR 64 | Temp 98.4°F | Wt 218.2 lb

## 2017-04-11 DIAGNOSIS — E559 Vitamin D deficiency, unspecified: Secondary | ICD-10-CM | POA: Diagnosis not present

## 2017-04-11 DIAGNOSIS — G5603 Carpal tunnel syndrome, bilateral upper limbs: Secondary | ICD-10-CM | POA: Diagnosis not present

## 2017-04-11 DIAGNOSIS — Z862 Personal history of diseases of the blood and blood-forming organs and certain disorders involving the immune mechanism: Secondary | ICD-10-CM

## 2017-04-11 DIAGNOSIS — R5383 Other fatigue: Secondary | ICD-10-CM

## 2017-04-11 LAB — CBC WITH DIFFERENTIAL/PLATELET
BASOS PCT: 0 %
Basophils Absolute: 0 cells/uL (ref 0–200)
Eosinophils Absolute: 114 cells/uL (ref 15–500)
Eosinophils Relative: 3 %
HCT: 34.2 % — ABNORMAL LOW (ref 35.0–45.0)
HEMOGLOBIN: 11.1 g/dL — AB (ref 11.7–15.5)
LYMPHS ABS: 1634 {cells}/uL (ref 850–3900)
Lymphocytes Relative: 43 %
MCH: 30.6 pg (ref 27.0–33.0)
MCHC: 32.5 g/dL (ref 32.0–36.0)
MCV: 94.2 fL (ref 80.0–100.0)
MONO ABS: 342 {cells}/uL (ref 200–950)
MPV: 11.7 fL (ref 7.5–12.5)
Monocytes Relative: 9 %
NEUTROS ABS: 1710 {cells}/uL (ref 1500–7800)
NEUTROS PCT: 45 %
Platelets: 176 10*3/uL (ref 140–400)
RBC: 3.63 MIL/uL — AB (ref 3.80–5.10)
RDW: 14 % (ref 11.0–15.0)
WBC: 3.8 10*3/uL — AB (ref 4.0–10.5)

## 2017-04-11 MED ORDER — IBUPROFEN 800 MG PO TABS: 800.0000 mg | ORAL_TABLET | Freq: Three times a day (TID) | ORAL | 1 refills | Status: DC | PRN

## 2017-04-11 NOTE — Patient Instructions (Signed)
Take the Ibuprofen 800 mg three times daily with food for the next 2-3 weeks and wear the wrist splints at night. You can wear them during the day if you need to.  Follow up in 2-3 weeks to see if you are improving.  If your symptoms get worse then let me know.  We will call you with lab results.   Carpal Tunnel Syndrome Carpal tunnel syndrome is a condition that causes pain in your hand and arm. The carpal tunnel is a narrow area located on the palm side of your wrist. Repeated wrist motion or certain diseases may cause swelling within the tunnel. This swelling pinches the main nerve in the wrist (median nerve). What are the causes? This condition may be caused by:  Repeated wrist motions.  Wrist injuries.  Arthritis.  A cyst or tumor in the carpal tunnel.  Fluid buildup during pregnancy. Sometimes the cause of this condition is not known. What increases the risk? This condition is more likely to develop in:  People who have jobs that cause them to repeatedly move their wrists in the same motion, such as Health visitorbutchers and cashiers.  Women.  People with certain conditions, such as:  Diabetes.  Obesity.  An underactive thyroid (hypothyroidism).  Kidney failure. What are the signs or symptoms? Symptoms of this condition include:  A tingling feeling in your fingers, especially in your thumb, index, and middle fingers.  Tingling or numbness in your hand.  An aching feeling in your entire arm, especially when your wrist and elbow are bent for long periods of time.  Wrist pain that goes up your arm to your shoulder.  Pain that goes down into your palm or fingers.  A weak feeling in your hands. You may have trouble grabbing and holding items. Your symptoms may feel worse during the night. How is this diagnosed? This condition is diagnosed with a medical history and physical exam. You may also have tests, including:  An electromyogram (EMG). This test measures electrical  signals sent by your nerves into the muscles.  X-rays. How is this treated? Treatment for this condition includes:  Lifestyle changes. It is important to stop doing or modify the activity that caused your condition.  Physical or occupational therapy.  Medicines for pain and inflammation. This may include medicine that is injected into your wrist.  A wrist splint.  Surgery. Follow these instructions at home: If you have a splint:   Wear it as told by your health care provider. Remove it only as told by your health care provider.  Loosen the splint if your fingers become numb and tingle, or if they turn cold and blue.  Keep the splint clean and dry. General instructions   Take over-the-counter and prescription medicines only as told by your health care provider.  Rest your wrist from any activity that may be causing your pain. If your condition is work related, talk to your employer about changes that can be made, such as getting a wrist pad to use while typing.  If directed, apply ice to the painful area:  Put ice in a plastic bag.  Place a towel between your skin and the bag.  Leave the ice on for 20 minutes, 2-3 times per day.  Keep all follow-up visits as told by your health care provider. This is important.  Do any exercises as told by your health care provider, physical therapist, or occupational therapist. Contact a health care provider if:  You have new symptoms.  Your pain is not controlled with medicines.  Your symptoms get worse. This information is not intended to replace advice given to you by your health care provider. Make sure you discuss any questions you have with your health care provider. Document Released: 11/05/2000 Document Revised: 03/18/2016 Document Reviewed: 03/26/2015 Elsevier Interactive Patient Education  2017 [employer].

## 2017-04-11 NOTE — Progress Notes (Signed)
Subjective:    Patient ID: Roberta Galvan, female    DOB: 07/10/1986, 31 y.o.   MRN: [id number]  HPI Chief Complaint  Patient presents with  . wrist pain    wrist pain- started last year and you prescribed a med but she ran out. (said it was for vitamin d and iron???)   She is here with complaints of 2-3 week history of bilateral wrist pain. States pain is worse with movement. She has a history of carpal tunnel syndrome and states she had a flare up last year around this time. Denies any issues with this over the past several months.  Complains of feeling like her fingers are all are numb on both hands when she wakes up. States she has not been wearing wrist splints but has worn them in the past with relief.  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   She is also complaining of mild fatigue for the past few weeks and states she did not have fatigue or wrist pain while she was taking iron and vitamin D. States she has not been taking vitamin D or iron for several months.   States she has been feeling cold lately as well when everyone else is comfortable.   LMP: last week. No contraception.   Denies any other joint pain. No morning stiffness. Denies weakness to extremities.  No fever, chills, unexplained weight loss, dizziness, chest pain, palpitations, shortness of breath, cough, abdominal pain, N/V/D. No changes in bowel habits or urinary symptoms.   Reviewed allergies, medications, past medical, surgical, family, and social history.    Review of Systems Pertinent positives and negatives in the history of present illness.     Objective:   Physical Exam  Constitutional: She is oriented to person, place, and time. She appears well-developed and well-nourished. No distress.  HENT:  Mouth/Throat: Uvula is midline, oropharynx is clear and moist and mucous membranes are normal.  Eyes: Conjunctivae and EOM are normal.  Pupils are equal, round, and reactive to light.  Neck: Normal range of motion. Neck supple. No thyromegaly present.  Cardiovascular: Normal rate, regular rhythm and normal heart sounds.   Pulmonary/Chest: Effort normal.  Musculoskeletal:       Right wrist: She exhibits tenderness. She exhibits normal range of motion and no swelling.       Left wrist: She exhibits tenderness. She exhibits normal range of motion and no swelling.       Right hand: Normal.       Left hand: Normal.  Normal sensation, cap refill, pulses, ROM and strength to bilateral wrist. Tinel's and Phalen's positive. Positive flick test bilaterally but worse with left.  No erythema, increased warmth or edema.   Lymphadenopathy:    She has no cervical adenopathy.  Neurological: She is alert and oriented to person, place, and time. She has normal strength. No cranial nerve deficit or sensory deficit. Coordination and gait normal.  Skin: Skin is warm and dry. No rash noted. No pallor.  Psychiatric: She has a normal mood and affect. Her speech is normal and behavior is normal. Thought content normal.   BP 120/80   Pulse 64   Temp 98.4 F (36.9 C) (Oral)   Wt 218 lb 3.2 oz (99 kg)   BMI 34.95 kg/m       Assessment & Plan:  Bilateral carpal tunnel syndrome  Other fatigue - Plan: CBC with Differential/Platelet, Basic metabolic  panel, VITAMIN D 25 Hydroxy (Vit-D Deficiency, Fractures), TSH  History of anemia - Plan: CBC with Differential/Platelet  Vitamin D deficiency - Plan: VITAMIN D 25 Hydroxy (Vit-D Deficiency, Fractures)  Discussed that her symptoms and exam are obvious for carpal tunnel syndrome as she has had in the past. No sign of infectious joint. No other joint involvement or morning stiffness and not suspicious for RA. Recommend referral to hand specialist or for nerve conduction studies and patient refuses. States she would like to see if her symptoms improve with NSAIDs and splinting as they have done in the  past.  I am ok with this. She will follow up in 2-3 weeks and is aware that if symptoms are not improving then she will need a referral or further testing.  Discussed history of anemia and vitamin D defiency. She reportedly had more energy while she was taking iron and vitamin D supplements but she stopped these and did not follow up. We will recheck labs today and start her back on supplements as needed.  Follow up 2-3 weeks for fatigue and bilateral wrist pain. If she is not improving I plan to refer her to a hand specialist.

## 2017-04-12 ENCOUNTER — Other Ambulatory Visit: Payer: Self-pay | Admitting: Family Medicine

## 2017-04-12 DIAGNOSIS — E559 Vitamin D deficiency, unspecified: Secondary | ICD-10-CM

## 2017-04-12 LAB — BASIC METABOLIC PANEL
BUN: 15 mg/dL (ref 7–25)
CHLORIDE: 109 mmol/L (ref 98–110)
CO2: 20 mmol/L (ref 20–31)
Calcium: 8.6 mg/dL (ref 8.6–10.2)
Creat: 0.71 mg/dL (ref 0.50–1.10)
GLUCOSE: 82 mg/dL (ref 65–99)
POTASSIUM: 4.4 mmol/L (ref 3.5–5.3)
Sodium: 142 mmol/L (ref 135–146)

## 2017-04-12 LAB — TSH: TSH: 1.02 mIU/L

## 2017-04-12 LAB — VITAMIN D 25 HYDROXY (VIT D DEFICIENCY, FRACTURES): Vit D, 25-Hydroxy: 19 ng/mL — ABNORMAL LOW (ref 30–100)

## 2017-04-12 MED ORDER — VITAMIN D (ERGOCALCIFEROL) 1.25 MG (50000 UNIT) PO CAPS: 50000.0000 [IU] | ORAL_CAPSULE | ORAL | 0 refills | Status: DC

## 2017-04-13 ENCOUNTER — Other Ambulatory Visit: Payer: Self-pay | Admitting: Family Medicine

## 2017-04-13 MED ORDER — FERROUS SULFATE 325 (65 FE) MG PO TABS: 325.0000 mg | ORAL_TABLET | Freq: Every day | ORAL | 2 refills | Status: DC

## 2017-05-02 ENCOUNTER — Encounter: Payer: Self-pay | Admitting: Family Medicine

## 2017-05-02 ENCOUNTER — Ambulatory Visit (INDEPENDENT_AMBULATORY_CARE_PROVIDER_SITE_OTHER): Payer: BC Managed Care – PPO | Admitting: Family Medicine

## 2017-05-02 VITALS — BP 110/60 | HR 72 | Wt 220.0 lb

## 2017-05-02 DIAGNOSIS — G5603 Carpal tunnel syndrome, bilateral upper limbs: Secondary | ICD-10-CM

## 2017-05-02 DIAGNOSIS — D500 Iron deficiency anemia secondary to blood loss (chronic): Secondary | ICD-10-CM

## 2017-05-02 DIAGNOSIS — E559 Vitamin D deficiency, unspecified: Secondary | ICD-10-CM | POA: Diagnosis not present

## 2017-05-02 NOTE — Patient Instructions (Addendum)
Return in 4 weeks for a lab visit to have your vitamin D level and hemoglobin checked.   After you finish prescription vitamin D then start taking daily over the counter vitamin D 1,000 IU.

## 2017-05-02 NOTE — Progress Notes (Signed)
   Subjective:    Patient ID: Roberta Galvan, female    DOB: 04/09/1986, 31 y.o.   MRN: [id number]  HPI Chief Complaint  Patient presents with  . follow-up    follow-up   She is here for a follow up on carpal tunnel syndrome and denies any numbness, tingling or wrist pain. She has noted pain in her bilateral forearms but states as long as she takes her iron pills she has no pain.  Denies fever, chills, dizziness, chest pain, palpitations, numbness, tingling or weakness.   LMP: due 05/06/2017   Reviewed allergies, medications, past medical, surgical, and social history.   Review of Systems Pertinent positives and negatives in the history of present illness.     Objective:   Physical Exam BP 110/60   Pulse 72   Wt 220 lb (99.8 kg)   BMI 35.24 kg/m  Alert and oriented and in no acute distress. Bilateral upper extremities with normal sensation, cap refill, pulses and strength. Negative Tinel's and Phalen's.       Assessment & Plan:  Bilateral carpal tunnel syndrome  Vitamin D deficiency  Iron deficiency anemia due to chronic blood loss  Discussed that her carpal tunnel symptoms have resolved. She will call or return if symptoms return and we will send her for nerve conduction testing or to a hand specialist.  She is currently taking vitamin D 50,000 IU once weekly and will return for a lab visit in 4 weeks after finishing this.  She is taking iron daily and states she feels better since being on this. We will repeat a CBC in 4 weeks also.

## 2017-06-03 ENCOUNTER — Other Ambulatory Visit: Payer: BC Managed Care – PPO

## 2017-06-09 ENCOUNTER — Other Ambulatory Visit: Payer: Self-pay | Admitting: Family Medicine

## 2017-06-09 DIAGNOSIS — D509 Iron deficiency anemia, unspecified: Secondary | ICD-10-CM

## 2017-06-10 ENCOUNTER — Other Ambulatory Visit: Payer: BC Managed Care – PPO

## 2017-06-10 DIAGNOSIS — D509 Iron deficiency anemia, unspecified: Secondary | ICD-10-CM

## 2017-06-10 DIAGNOSIS — E559 Vitamin D deficiency, unspecified: Secondary | ICD-10-CM

## 2017-06-10 LAB — CBC WITH DIFFERENTIAL/PLATELET
BASOS PCT: 1 %
Basophils Absolute: 37 cells/uL (ref 0–200)
Eosinophils Absolute: 74 cells/uL (ref 15–500)
Eosinophils Relative: 2 %
HCT: 35.9 % (ref 35.0–45.0)
Hemoglobin: 11.6 g/dL — ABNORMAL LOW (ref 11.7–15.5)
LYMPHS PCT: 58 %
Lymphs Abs: 2146 cells/uL (ref 850–3900)
MCH: 30.8 pg (ref 27.0–33.0)
MCHC: 32.3 g/dL (ref 32.0–36.0)
MCV: 95.2 fL (ref 80.0–100.0)
MONO ABS: 333 {cells}/uL (ref 200–950)
MONOS PCT: 9 %
MPV: 11 fL (ref 7.5–12.5)
Neutro Abs: 1110 cells/uL — ABNORMAL LOW (ref 1500–7800)
Neutrophils Relative %: 30 %
PLATELETS: 185 10*3/uL (ref 140–400)
RBC: 3.77 MIL/uL — AB (ref 3.80–5.10)
RDW: 13.4 % (ref 11.0–15.0)
WBC: 3.7 10*3/uL — AB (ref 4.0–10.5)

## 2017-06-11 LAB — VITAMIN D 25 HYDROXY (VIT D DEFICIENCY, FRACTURES): Vit D, 25-Hydroxy: 25 ng/mL — ABNORMAL LOW (ref 30–100)

## 2017-06-13 ENCOUNTER — Other Ambulatory Visit: Payer: Self-pay | Admitting: Family Medicine

## 2017-06-13 DIAGNOSIS — E559 Vitamin D deficiency, unspecified: Secondary | ICD-10-CM

## 2017-06-13 MED ORDER — VITAMIN D (ERGOCALCIFEROL) 1.25 MG (50000 UNIT) PO CAPS: 50000.0000 [IU] | ORAL_CAPSULE | ORAL | 0 refills | Status: DC

## 2017-06-30 ENCOUNTER — Ambulatory Visit (INDEPENDENT_AMBULATORY_CARE_PROVIDER_SITE_OTHER): Payer: BC Managed Care – PPO | Admitting: Family Medicine

## 2017-06-30 ENCOUNTER — Ambulatory Visit (HOSPITAL_COMMUNITY)
Admission: RE | Admit: 2017-06-30 | Discharge: 2017-06-30 | Disposition: A | Discharge status: Home / Self Care | Payer: BC Managed Care – PPO | Source: Ambulatory Visit | Attending: Family Medicine | Admitting: Family Medicine

## 2017-06-30 ENCOUNTER — Encounter: Payer: Self-pay | Admitting: Family Medicine

## 2017-06-30 VITALS — BP 110/64 | HR 57 | Temp 98.5°F | Wt 227.8 lb

## 2017-06-30 DIAGNOSIS — R142 Eructation: Secondary | ICD-10-CM | POA: Diagnosis not present

## 2017-06-30 DIAGNOSIS — R10821 Right upper quadrant rebound abdominal tenderness: Secondary | ICD-10-CM | POA: Diagnosis present

## 2017-06-30 DIAGNOSIS — R141 Gas pain: Secondary | ICD-10-CM | POA: Diagnosis not present

## 2017-06-30 DIAGNOSIS — R1013 Epigastric pain: Secondary | ICD-10-CM | POA: Diagnosis not present

## 2017-06-30 DIAGNOSIS — R143 Flatulence: Secondary | ICD-10-CM | POA: Diagnosis not present

## 2017-06-30 LAB — COMPREHENSIVE METABOLIC PANEL
ALT: 19 U/L (ref 6–29)
AST: 19 U/L (ref 10–30)
Albumin: 4 g/dL (ref 3.6–5.1)
Alkaline Phosphatase: 45 U/L (ref 33–115)
BILIRUBIN TOTAL: 0.3 mg/dL (ref 0.2–1.2)
BUN: 15 mg/dL (ref 7–25)
CALCIUM: 9.2 mg/dL (ref 8.6–10.2)
CHLORIDE: 105 mmol/L (ref 98–110)
CO2: 24 mmol/L (ref 20–32)
Creat: 0.74 mg/dL (ref 0.50–1.10)
GLUCOSE: 82 mg/dL (ref 65–99)
Potassium: 4.8 mmol/L (ref 3.5–5.3)
Sodium: 138 mmol/L (ref 135–146)
Total Protein: 6.8 g/dL (ref 6.1–8.1)

## 2017-06-30 LAB — CBC WITH DIFFERENTIAL/PLATELET
BASOS ABS: 0 {cells}/uL (ref 0–200)
Basophils Relative: 0 %
EOS ABS: 36 {cells}/uL (ref 15–500)
EOS PCT: 1 %
HEMATOCRIT: 37 % (ref 35.0–45.0)
Hemoglobin: 12.1 g/dL (ref 11.7–15.5)
LYMPHS PCT: 49 %
Lymphs Abs: 1764 cells/uL (ref 850–3900)
MCH: 31 pg (ref 27.0–33.0)
MCHC: 32.7 g/dL (ref 32.0–36.0)
MCV: 94.9 fL (ref 80.0–100.0)
MONO ABS: 360 {cells}/uL (ref 200–950)
MPV: 12.3 fL (ref 7.5–12.5)
Monocytes Relative: 10 %
NEUTROS PCT: 40 %
Neutro Abs: 1440 cells/uL — ABNORMAL LOW (ref 1500–7800)
Platelets: 145 10*3/uL (ref 140–400)
RBC: 3.9 MIL/uL (ref 3.80–5.10)
RDW: 13.4 % (ref 11.0–15.0)
WBC: 3.6 10*3/uL — ABNORMAL LOW (ref 4.0–10.5)

## 2017-06-30 LAB — POCT URINALYSIS DIP (PROADVANTAGE DEVICE)
BILIRUBIN UA: NEGATIVE
Glucose, UA: NEGATIVE mg/dL
Ketones, POC UA: NEGATIVE mg/dL
NITRITE UA: NEGATIVE
Protein Ur, POC: NEGATIVE mg/dL
RBC UA: NEGATIVE
Specific Gravity, Urine: 1.03
UUROB: NEGATIVE
pH, UA: 6 (ref 5.0–8.0)

## 2017-06-30 LAB — LIPASE: LIPASE: 15 U/L (ref 7–60)

## 2017-06-30 LAB — POCT URINE PREGNANCY: PREG TEST UR: NEGATIVE

## 2017-06-30 MED ORDER — HYDROCODONE-ACETAMINOPHEN 5-325 MG PO TABS: 1.0000 | ORAL_TABLET | Freq: Four times a day (QID) | ORAL | 0 refills | Status: DC | PRN

## 2017-06-30 NOTE — Patient Instructions (Addendum)
Nothing to eat or drink for your ultrasound this afternoon.   If you take the hydrocodone for pain then do not drive or operate machinery. This medication will make you sleepy.

## 2017-06-30 NOTE — Progress Notes (Signed)
   Subjective:    Patient ID: Roberta Galvan, female    DOB: 05/18/1986, 31 y.o.   MRN: [id number]  HPI Chief Complaint  Patient presents with  . stomach and chest pain    stomach pain since monday, can feel something moving up into her chest causing discomfort   She is here with complaints of a 3 day history of upper abdominal pain and burning sensation in her chest. Pain is worse when she eats. Also complains of increased belching and flatulence.  Pain has been constant dull ache but increases in intensity post prandial. Pain is non radiating.   States she had a normal bowel movement yesterday without blood.   Taking medication for gas and states it helps temporarily.   Denies fever, chills, nausea, vomiting, diarrhea, blood in stool. No urinary symptoms.   LMP: 06/15/2016 Does not use contraception.   Reviewed allergies, medications, past medical, surgical, and social history.   Review of Systems Pertinent positives and negatives in the history of present illness.     Objective:   Physical Exam  Constitutional: She is oriented to person, place, and time. She appears well-developed and well-nourished. No distress.  Visibly uncomfortable.   HENT:  Mouth/Throat: Oropharynx is clear and moist.  Eyes: Pupils are equal, round, and reactive to light. Conjunctivae are normal.  Cardiovascular: Normal rate, regular rhythm and normal heart sounds.  Exam reveals no gallop and no friction rub.   No murmur heard. Pulmonary/Chest: Effort normal and breath sounds normal.  Abdominal: Soft. Normal appearance and bowel sounds are normal. She exhibits no distension. There is no hepatosplenomegaly. There is tenderness in the right upper quadrant and epigastric area. There is rebound and positive Murphy's sign. There is no rigidity, no guarding, no CVA tenderness and no tenderness at McBurney's point.    Significant tenderness with rebound to RUQ. Murphy's sign pos.   Lymphadenopathy:    She  has no cervical adenopathy.  Neurological: She is alert and oriented to person, place, and time. She has normal strength. No cranial nerve deficit or sensory deficit. Gait normal.  Skin: Skin is warm and dry. No rash noted. She is not diaphoretic. No pallor.  Psychiatric: She has a normal mood and affect. Her speech is normal and behavior is normal. Thought content normal.   BP 110/64   Pulse (!) 57   Temp 98.5 F (36.9 C) (Oral)   Wt 227 lb 12.8 oz (103.3 kg)   BMI 36.49 kg/m       Assessment & Plan:  Right upper quadrant abdominal tenderness with rebound tenderness - Plan: CBC with Differential/Platelet, Comprehensive metabolic panel, POCT Urinalysis DIP (Proadvantage Device), POCT urine pregnancy, Lipase, US Abdomen Limited RUQ, CANCELED: US Abdomen Limited RUQ  Epigastric pain - Plan: CBC with Differential/Platelet, Comprehensive metabolic panel, Lipase, CANCELED: US Abdomen Limited RUQ  Flatulence, eructation and gas pain - Plan: POCT Urinalysis DIP (Proadvantage Device), POCT urine pregnancy, CANCELED: US Abdomen Limited RUQ  Discussed that her symptoms appear to be related to her gallbladder until proven otherwise. Will send her for an US and get blood work. Norco prescribed for severe pain and strict precautions given regarding medication.  We will follow up pending labs and US.

## 2017-08-12 ENCOUNTER — Other Ambulatory Visit: Payer: BC Managed Care – PPO

## 2017-08-19 ENCOUNTER — Other Ambulatory Visit: Payer: BC Managed Care – PPO

## 2017-08-19 DIAGNOSIS — E559 Vitamin D deficiency, unspecified: Secondary | ICD-10-CM

## 2017-08-20 LAB — VITAMIN D 25 HYDROXY (VIT D DEFICIENCY, FRACTURES): Vit D, 25-Hydroxy: 24 ng/mL — ABNORMAL LOW (ref 30–100)

## 2017-08-22 ENCOUNTER — Other Ambulatory Visit: Payer: Self-pay | Admitting: Family Medicine

## 2017-08-22 DIAGNOSIS — E559 Vitamin D deficiency, unspecified: Secondary | ICD-10-CM

## 2017-11-07 ENCOUNTER — Ambulatory Visit: Payer: BC Managed Care – PPO | Admitting: Family Medicine

## 2017-11-07 ENCOUNTER — Encounter: Payer: Self-pay | Admitting: Family Medicine

## 2017-11-07 VITALS — BP 100/60 | HR 60 | Ht 66.0 in | Wt 231.0 lb

## 2017-11-07 DIAGNOSIS — M5430 Sciatica, unspecified side: Secondary | ICD-10-CM | POA: Diagnosis not present

## 2017-11-07 DIAGNOSIS — Z713 Dietary counseling and surveillance: Secondary | ICD-10-CM | POA: Diagnosis not present

## 2017-11-07 DIAGNOSIS — E669 Obesity, unspecified: Secondary | ICD-10-CM | POA: Diagnosis not present

## 2017-11-07 MED ORDER — CYCLOBENZAPRINE HCL 5 MG PO TABS: 5.0000 mg | ORAL_TABLET | Freq: Every evening | ORAL | 0 refills | Status: DC | PRN

## 2017-11-07 NOTE — Progress Notes (Signed)
   Subjective:    Patient ID: Roberta Galvan, female    DOB: 10/20/1986, 31 y.o.   MRN: [id number]  HPI Chief Complaint  Patient presents with  . Leg Pain    started Sat evening. Entire leg and she is having numbness.   . Flu Vaccine    declined.    She is here with complaints of a 3 day history of right posterior buttock and leg pain and numbness down to her toes.  Denies injury. Pain is sharp and worse when laying on her right side but denies pain when laying on her back flat or on her left side. No pain with sitting unless she sits for a long period. Denies pain when standing unless standing for long periods.  Denies low back pain.   States leg felt the same as when she had an epidural 3 years ago for childbirth.   States heating pad helps with pain. Icy hot also helps.   No leg weakness. No saddle anesthesia. Denies loss of control of bowels or bladder.   LMP: 11/02/2017  She would also like to discuss weight gain. States she has gained weight gradually over the past year or two and would like help losing weight. States a friend of hers is taking diet pills. Reports diet high in rice and carbohydrates. Does not exercise.   Denies fever, chills, dizziness, chest pain, palpitations, shortness of breath, abdominal pain, N/V/D, urinary symptoms, LE edema.   Reviewed allergies, medications, past medical, surgical, family, and social history.   Review of Systems Pertinent positives and negatives in the history of present illness.     Objective:   Physical Exam  Constitutional: She is oriented to person, place, and time. She appears well-developed and well-nourished. No distress.  Neck: Normal range of motion. Neck supple.  Cardiovascular: Normal rate and regular rhythm.  Pulmonary/Chest: Effort normal and breath sounds normal.  Musculoskeletal:       Thoracic back: Normal.       Lumbar back: She exhibits tenderness. She exhibits no bony tenderness.       Back:  TTP over  right sciatic notch.  Negative straight leg raise.  RLE neurovascularly intact.   Neurological: She is alert and oriented to person, place, and time. She has normal strength and normal reflexes. No cranial nerve deficit or sensory deficit. Gait normal.  Skin: Skin is warm and dry. No bruising and no rash noted. No pallor.   BP 100/60   Pulse 60   Ht 5\' 6"  (1.676 m)   Wt 231 lb (104.8 kg)   LMP 11/02/2017 (Exact Date)   BMI 37.28 kg/m       Assessment & Plan:  Acute sciatica  Weight loss counseling, encounter for  Obesity (BMI 30-39.9)   No neurological deficits. She appears to have sciatica and we will attempt to calm this down with conservative treatment. She will try 2 Aleve twice daily and continue with heat and topical icy hot. Prescription for Flexeril sent to pharmacy with strict sedation precautions. She will follow up if not improving or any new or worsening symptoms. No red flags currently. Aleve samples given #12.   Counseled on healthy lifestyle modifications.  My Fitness Pal app recommended.

## 2017-11-07 NOTE — Patient Instructions (Addendum)
Take 2 Aleve twice daily for the next week and use the muscle relaxant as needed at bedtime. Be aware that this may make you sleepy. Do not drive or drink alcohol with it.  Continue using heat and icy hot since this is helping.    Download free app called My Fitness Pal. Start keeping an eye on your calories and carbohydrates. Cut back on carbs and sugars.

## 2017-11-24 ENCOUNTER — Encounter: Payer: Self-pay | Admitting: Family Medicine

## 2017-11-24 ENCOUNTER — Ambulatory Visit: Payer: BC Managed Care – PPO | Admitting: Family Medicine

## 2017-11-24 VITALS — BP 132/82 | HR 64 | Ht 66.0 in | Wt 233.2 lb

## 2017-11-24 DIAGNOSIS — R0789 Other chest pain: Secondary | ICD-10-CM | POA: Diagnosis not present

## 2017-11-24 DIAGNOSIS — R631 Polydipsia: Secondary | ICD-10-CM

## 2017-11-24 DIAGNOSIS — R531 Weakness: Secondary | ICD-10-CM | POA: Diagnosis not present

## 2017-11-24 DIAGNOSIS — R350 Frequency of micturition: Secondary | ICD-10-CM

## 2017-11-24 LAB — POCT URINALYSIS DIP (PROADVANTAGE DEVICE)
Bilirubin, UA: NEGATIVE
Blood, UA: NEGATIVE
GLUCOSE UA: NEGATIVE mg/dL
Ketones, POC UA: NEGATIVE mg/dL
NITRITE UA: NEGATIVE
Protein Ur, POC: NEGATIVE mg/dL
Specific Gravity, Urine: 1.015
UUROB: NEGATIVE
pH, UA: 7.5 (ref 5.0–8.0)

## 2017-11-24 LAB — POCT URINE PREGNANCY: PREG TEST UR: NEGATIVE

## 2017-11-24 LAB — GLUCOSE, POCT (MANUAL RESULT ENTRY): POC GLUCOSE: 86 mg/dL (ref 70–99)

## 2017-11-24 NOTE — Progress Notes (Signed)
Subjective:    Patient ID: Roberta Galvan, female    DOB: 11/21/1986, 32 y.o.   MRN: [id number]  HPI Chief Complaint  Patient presents with  . Acute Visit    sharp chest pain this morning, EKG (EMS did, results in room), body shakes, weakness, fell in the floor at home   She reports having a 90 minute episode of central non radiating sharp chest pain that started while getting her kids ready for school. Pain resolved without intervention. Now she reports having soreness to her chest wall. Pain is reproducible with deep inspiration. She also reports feeling lightheaded, generalized weakness and "shaky" during the episode.  States she called EMS and they did a 12 lead and ruled out a cardiac etiology. States they told her this was most likely anxiety. She does report hyperventilating and having nausea at that time but denies history of anxiety or panic.  Chest pain is not elicited with exertion or position changes.   She [facility] 12 lead [facility]. Her vitals are on the sheet. BP 136/76, pulse 60 and SpO2 100% on RA.   States now she continues feeling weak and shaky, no focal weakness.  No leg pain. No history of blood clot.  Denies recent surgery, immobilization.   Denies recent URI.  Denies fever, chills, headache, vision changes, tinnitus, sore throat, fatigue, rash, palpitations, shortness of breath, cough, wheezing, abdominal pain, vomiting, diarrhea, LE edema.  States she has been drinking a lot of water and urinating frequently but this is ongoing and not new per patient.  No history of diabetes.   Her husband is with her but is not talking.   LMP: 11/02/2017  Reviewed allergies, medications, past medical, surgical, family, and social history.    Review of Systems Pertinent positives and negatives in the history of present illness.     Objective:   Physical Exam  Constitutional: She is oriented to person, place, and time. She appears well-developed and  well-nourished. No distress.  HENT:  Right Ear: Tympanic membrane and ear canal normal.  Left Ear: Tympanic membrane and ear canal normal.  Nose: Nose normal.  Mouth/Throat: Uvula is midline, oropharynx is clear and moist and mucous membranes are normal.  Eyes: Conjunctivae, EOM and lids are normal. Pupils are equal, round, and reactive to light.  Neck: Full passive range of motion without pain. Neck supple. No JVD present. No thyromegaly present.  Cardiovascular: Normal rate, regular rhythm, normal heart sounds and intact distal pulses. Exam reveals no gallop and no friction rub.  No murmur heard. No LE edema  Pulmonary/Chest: Effort normal and breath sounds normal.  Musculoskeletal: Normal range of motion.  No calf tenderness, negative Homan's  Lymphadenopathy:    She has no cervical adenopathy.  Neurological: She is alert and oriented to person, place, and time. She has normal strength and normal reflexes. She displays no tremor. No cranial nerve deficit or sensory deficit. Coordination and gait normal.  Skin: Skin is warm and dry. No rash noted. She is not diaphoretic. No pallor.  Psychiatric: She has a normal mood and affect. Her speech is normal and behavior is normal. Thought content normal. Cognition and memory are normal.   BP 132/82 (BP Location: Right Arm, Patient Position: Sitting)   Pulse 64   Ht 5\' 6"  (1.676 m)   Wt 233 lb 3.2 oz (105.8 kg)   LMP 11/02/2017 (Exact Date)   SpO2 97%   BMI 37.64 kg/m  Assessment & Plan:  Chest pain, localized - Plan: CBC with Differential/Platelet, Comprehensive metabolic panel, EKG 12-Lead, DG Chest 2 View  Chest wall tenderness  Generalized weakness - Plan: CBC with Differential/Platelet, Comprehensive metabolic panel, POCT Urinalysis DIP (Proadvantage Device), POCT urine pregnancy, TSH, EKG 12-Lead, DG Chest 2 View  Urinary frequency - Plan: POCT glucose (manual entry)  Increased thirst - Plan: POCT glucose (manual  entry)  ECG read by Dr. Susann Givens suggests early repolarization and sinus brady. Very low suspicion for DVT or PE.  Discussed that localized chest pain and point tenderness speaks to this being a musculoskeletal etiology. I also question anxiety as a possible differential.  Fasting finger stick glucose: 86 UA dipstick: spec grav 1.015, leuk 3+, neg otherwise. Urinary frequency is chronic per patient.  UPT negative Will check labs, chest XR and follow up. Strict precautions that if pain returns or any new or worsening symptoms that she should seek medical care.

## 2017-11-24 NOTE — Patient Instructions (Addendum)
Try taking Ibuprofen 800 mg three times daily with food to see if this helps with your chest wall pain.   If your pain returns or you have any new or worsening symptoms then you should be seen again either here or in the emergency department.   We will call you with your lab results.

## 2017-11-25 ENCOUNTER — Telehealth: Payer: Self-pay

## 2017-11-25 ENCOUNTER — Ambulatory Visit
Admission: RE | Admit: 2017-11-25 | Discharge: 2017-11-25 | Disposition: A | Discharge status: Home / Self Care | Payer: BC Managed Care – PPO | Source: Ambulatory Visit | Attending: Family Medicine | Admitting: Family Medicine

## 2017-11-25 DIAGNOSIS — R0789 Other chest pain: Secondary | ICD-10-CM

## 2017-11-25 DIAGNOSIS — R531 Weakness: Secondary | ICD-10-CM

## 2017-11-25 LAB — TSH: TSH: 1.2 mIU/L

## 2017-11-25 LAB — CBC WITH DIFFERENTIAL/PLATELET
BASOS PCT: 0.5 %
Basophils Absolute: 19 cells/uL (ref 0–200)
Eosinophils Absolute: 38 cells/uL (ref 15–500)
Eosinophils Relative: 1 %
HCT: 37 % (ref 35.0–45.0)
Hemoglobin: 12.5 g/dL (ref 11.7–15.5)
Lymphs Abs: 1410 cells/uL (ref 850–3900)
MCH: 30.9 pg (ref 27.0–33.0)
MCHC: 33.8 g/dL (ref 32.0–36.0)
MCV: 91.4 fL (ref 80.0–100.0)
MONOS PCT: 12.3 %
MPV: 12.5 fL (ref 7.5–12.5)
Neutro Abs: 1866 cells/uL (ref 1500–7800)
Neutrophils Relative %: 49.1 %
PLATELETS: 135 10*3/uL — AB (ref 140–400)
RBC: 4.05 10*6/uL (ref 3.80–5.10)
RDW: 12.4 % (ref 11.0–15.0)
TOTAL LYMPHOCYTE: 37.1 %
WBC mixed population: 467 cells/uL (ref 200–950)
WBC: 3.8 10*3/uL (ref 3.8–10.8)

## 2017-11-25 LAB — COMPREHENSIVE METABOLIC PANEL
AG Ratio: 1.4 (calc) (ref 1.0–2.5)
ALT: 19 U/L (ref 6–29)
AST: 18 U/L (ref 10–30)
Albumin: 4 g/dL (ref 3.6–5.1)
Alkaline phosphatase (APISO): 44 U/L (ref 33–115)
BUN: 13 mg/dL (ref 7–25)
CHLORIDE: 104 mmol/L (ref 98–110)
CO2: 28 mmol/L (ref 20–32)
CREATININE: 0.73 mg/dL (ref 0.50–1.10)
Calcium: 9.1 mg/dL (ref 8.6–10.2)
GLOBULIN: 2.9 g/dL (ref 1.9–3.7)
GLUCOSE: 85 mg/dL (ref 65–99)
Potassium: 4.2 mmol/L (ref 3.5–5.3)
Sodium: 137 mmol/L (ref 135–146)
Total Bilirubin: 0.7 mg/dL (ref 0.2–1.2)
Total Protein: 6.9 g/dL (ref 6.1–8.1)

## 2017-11-25 NOTE — Telephone Encounter (Signed)
-----   Message from Avanell ShackletonVickie L Henson, NP-C sent at 11/25/2017  8:10 AM EST ----- Her blood work is fine.

## 2017-11-25 NOTE — Telephone Encounter (Signed)
Called patient and gave lab results. Patient had no questions or concerns.  

## 2017-11-25 NOTE — Telephone Encounter (Signed)
Called patient and advised of results. No questions.

## 2017-11-25 NOTE — Telephone Encounter (Signed)
-----   Message from Avanell ShackletonVickie L Henson, NP-C sent at 11/25/2017 11:32 AM EST ----- Her chest XR is normal.

## 2018-01-27 ENCOUNTER — Ambulatory Visit: Payer: BC Managed Care – PPO | Admitting: Family Medicine

## 2018-01-27 ENCOUNTER — Encounter: Payer: Self-pay | Admitting: Family Medicine

## 2018-01-27 VITALS — BP 120/70 | HR 64 | Temp 98.2°F | Wt 234.4 lb

## 2018-01-27 DIAGNOSIS — N3000 Acute cystitis without hematuria: Secondary | ICD-10-CM

## 2018-01-27 DIAGNOSIS — R309 Painful micturition, unspecified: Secondary | ICD-10-CM | POA: Diagnosis not present

## 2018-01-27 LAB — POCT URINALYSIS DIP (PROADVANTAGE DEVICE)
BILIRUBIN UA: NEGATIVE mg/dL
Bilirubin, UA: NEGATIVE
GLUCOSE UA: NEGATIVE mg/dL
Nitrite, UA: NEGATIVE
PH UA: 6 (ref 5.0–8.0)
Protein Ur, POC: NEGATIVE mg/dL
RBC UA: NEGATIVE
Specific Gravity, Urine: 1.025
Urobilinogen, Ur: NEGATIVE

## 2018-01-27 MED ORDER — SULFAMETHOXAZOLE-TRIMETHOPRIM 800-160 MG PO TABS: 1.0000 | ORAL_TABLET | Freq: Two times a day (BID) | ORAL | 0 refills | Status: DC

## 2018-01-27 NOTE — Progress Notes (Signed)
Chief Complaint  Patient presents with  . pain with urination    pain with urination for the last 2 weeks    Subjective:  Roberta Galvan is a 32 y.o. female who complains of possible urinary tract infection.  She has had symptoms for 2 weeks.  Symptoms include urinary frequency, urgency, suprapubic pressure. Patient denies fever, chills, back pain, N/V/D.  Last UTI was several months ago .   Using nothing for current symptoms.    Patient does not have a history of recurrent UTI. Patient does not have a history of pyelonephritis.  No other aggravating or relieving factors.   Also complains of right ear discomfort. No nasal congestion, rhinorrhea or cough.   LMP: 2 weeks ago   Past Medical History:  Diagnosis Date  . Complication of anesthesia    NUMBNESS IN  LEGS  . Headache behind the eyes   . History of anemia   . No pertinent past medical history   . Vitamin D deficiency     ROS as in subjective  Reviewed allergies, medications, past medical, surgical, and social history.    Objective: Vitals:   01/27/18 1336  BP: 120/70  Pulse: 64  Temp: 98.2 F (36.8 C)  SpO2: 98%    General appearance: alert, no distress, WD/WN, female  Right TM with fluid normal TM otherwise. Left TM normal  Abdomen: +bs, soft, non tender, non distended, no masses, no hepatomegaly, no splenomegaly, no bruits Back: no CVA tenderness GU: declines      Laboratory:  Urine dipstick: 3+ for leukocyte esterase.       Assessment: Acute cystitis without hematuria - Plan: sulfamethoxazole-trimethoprim (BACTRIM DS,SEPTRA DS) 800-160 MG tablet  Pain with urination - Plan: POCT Urinalysis DIP (Proadvantage Device)    Plan: Discussed symptoms, diagnosis, possible complications, and usual course of illness.  Take Bactrim as prescribed.  Advised increased water intake, can use OTC Tylenol for pain.    Advised to increase water intake.     Urine culture was not sent.     May use nasal  decongestant for right ear fluid  Call or return if worse or not improving.

## 2018-01-27 NOTE — Patient Instructions (Signed)
Take the antibiotic as prescribed. Drink plenty of water.   You can use a decongestant nasal spray such as Nasocort or Flonase to help with your right ear or even try oral Sudafed or the generic.

## 2018-07-17 ENCOUNTER — Encounter: Payer: Self-pay | Admitting: Family Medicine

## 2018-07-17 ENCOUNTER — Ambulatory Visit: Payer: BC Managed Care – PPO | Admitting: Family Medicine

## 2018-07-17 VITALS — BP 120/70 | HR 70 | Temp 98.6°F | Wt 227.4 lb

## 2018-07-17 DIAGNOSIS — M79671 Pain in right foot: Secondary | ICD-10-CM | POA: Diagnosis not present

## 2018-07-17 DIAGNOSIS — M545 Low back pain, unspecified: Secondary | ICD-10-CM

## 2018-07-17 DIAGNOSIS — M79672 Pain in left foot: Secondary | ICD-10-CM

## 2018-07-17 DIAGNOSIS — M79632 Pain in left forearm: Secondary | ICD-10-CM | POA: Diagnosis not present

## 2018-07-17 DIAGNOSIS — R6889 Other general symptoms and signs: Secondary | ICD-10-CM

## 2018-07-17 DIAGNOSIS — M79631 Pain in right forearm: Secondary | ICD-10-CM

## 2018-07-17 DIAGNOSIS — E559 Vitamin D deficiency, unspecified: Secondary | ICD-10-CM

## 2018-07-17 DIAGNOSIS — K5900 Constipation, unspecified: Secondary | ICD-10-CM

## 2018-07-17 DIAGNOSIS — Z862 Personal history of diseases of the blood and blood-forming organs and certain disorders involving the immune mechanism: Secondary | ICD-10-CM

## 2018-07-17 LAB — POCT URINALYSIS DIP (PROADVANTAGE DEVICE)
BILIRUBIN UA: NEGATIVE
GLUCOSE UA: NEGATIVE mg/dL
Ketones, POC UA: NEGATIVE mg/dL
Leukocytes, UA: NEGATIVE
NITRITE UA: NEGATIVE
Protein Ur, POC: NEGATIVE mg/dL
RBC UA: NEGATIVE
SPECIFIC GRAVITY, URINE: 1.03
Urobilinogen, Ur: NEGATIVE
pH, UA: 6 (ref 5.0–8.0)

## 2018-07-17 NOTE — Progress Notes (Signed)
Subjective:    Patient ID: Roberta Galvan, female    DOB: 1986/02/02, 32 y.o.   MRN: [id number]  HPI Chief Complaint  Patient presents with  . joint aching    joint aching and feeling cold all the time for the last week. taking her iron pills   She is a 32 year old female with a history of anemia, vitamin D deficiency and fatigue who presents with a one week history of feeling cold, low back pain, bilateral forearm pain with certain movements and bilateral intermittent posterior heel pain.  States her stools are firmer than usual.  Low back pain is midline and non radiating. No numbness, tingling or weakness. No saddle anesthesia. No incontinence. Pain is worse with certain movements and relieved with rest.  She has not taken anything for her pain.   Questionable urinary frequency.   She returned one month ago from [place]. States she did not take her iron or vitamin d while there. She had to get vaccines to go there including yellow fever.   LMP: 2 weeks ago. Regular periods.  No birth control  Denies fever, chills, dizziness, chest pain, palpitations, shortness of breath, abdominal pain, N/V/D, leg or calf pain or swelling.   Reviewed allergies, medications, past medical, surgical, family, and social history.    Review of Systems Pertinent positives and negatives in the history of present illness.     Objective:   Physical Exam  Constitutional: She is oriented to person, place, and time. She appears well-developed and well-nourished. No distress.  HENT:  Mouth/Throat: Oropharynx is clear and moist.  Eyes: Pupils are equal, round, and reactive to light. Conjunctivae and EOM are normal.  Neck: Normal range of motion. Neck supple. No thyromegaly present.  Cardiovascular: Normal rate, regular rhythm, normal heart sounds and intact distal pulses.  Pulmonary/Chest: Effort normal and breath sounds normal.  Musculoskeletal:       Right ankle: Normal. Achilles tendon normal.     Left ankle: Normal. Achilles tendon normal.       Cervical back: Normal.       Thoracic back: Normal.       Lumbar back: She exhibits tenderness and pain. She exhibits normal range of motion.       Right forearm: Normal.       Left forearm: Normal.  Midline lumbar tenderness without step off. Normal sensation and motion of spine. Some mild pain with extension and rotation.  Negative straight leg raise.  Bilateral LE without edema, warmth. Negative homans.   Lymphadenopathy:    She has no cervical adenopathy.  Neurological: She is alert and oriented to person, place, and time. She displays normal reflexes. No cranial nerve deficit or sensory deficit.  Skin: Skin is warm and dry. Capillary refill takes less than 2 seconds. No rash noted. No pallor.  Psychiatric: She has a normal mood and affect. Her behavior is normal. Thought content normal.   BP 120/70   Pulse 70   Temp 98.6 F (37 C) (Oral)   Wt 227 lb 6.4 oz (103.1 kg)   SpO2 99%   BMI 36.70 kg/m        Assessment & Plan:  Acute midline low back pain without sciatica - Plan: POCT Urinalysis DIP (Proadvantage Device)  Pain of both heels  Pain in both forearms  Cold intolerance - Plan: CBC with Differential/Platelet, Comprehensive metabolic panel, TSH, T4, free  Constipation, unspecified constipation type - Plan: TSH, T4, free  Vitamin D deficiency -  Plan: VITAMIN D 25 Hydroxy (Vit-D Deficiency, Fractures)  History of anemia - Plan: CBC with Differential/Platelet, Comprehensive metabolic panel  UA negative  No sign of any inflammatory or systemic condition. Exam unremarkable.  Try conservative treatment and follow up if worsening or not improving in the next week or so. Discussed taking an NSAID, using heat or ice and topical analgesic if she wishes.  Reports similar fatigue and muscle aches with IDA and low vitamin D.  Check labs and follow up. She will report back any new or worsening symptoms.

## 2018-07-17 NOTE — Patient Instructions (Addendum)
Continue on vitamin D and iron. You may try a stool softener over the counter. Make sure you are getting fiber in your diet.   You can try 800 mg of ibuprofen three times daily OR 2 Aleve twice daily for the next week or two to see if this helps with your aches and pain. Let me know if this is not improving.   Also, try using heat or ice and stretching. Topical pain medication may also help such as Biofreeze, salon pas patches, arnicare gel.

## 2018-07-18 ENCOUNTER — Encounter: Payer: Self-pay | Admitting: Family Medicine

## 2018-07-18 LAB — COMPREHENSIVE METABOLIC PANEL
ALK PHOS: 47 IU/L (ref 39–117)
ALT: 10 IU/L (ref 0–32)
AST: 17 IU/L (ref 0–40)
Albumin/Globulin Ratio: 1.4 (ref 1.2–2.2)
Albumin: 4.2 g/dL (ref 3.5–5.5)
BUN/Creatinine Ratio: 15 (ref 9–23)
BUN: 11 mg/dL (ref 6–20)
Bilirubin Total: 0.3 mg/dL (ref 0.0–1.2)
CHLORIDE: 104 mmol/L (ref 96–106)
CO2: 22 mmol/L (ref 20–29)
Calcium: 9.2 mg/dL (ref 8.7–10.2)
Creatinine, Ser: 0.74 mg/dL (ref 0.57–1.00)
GFR calc Af Amer: 123 mL/min/{1.73_m2} (ref >59)
GFR calc non Af Amer: 107 mL/min/{1.73_m2} (ref >59)
GLUCOSE: 85 mg/dL (ref 65–99)
Globulin, Total: 3 g/dL (ref 1.5–4.5)
Potassium: 4.1 mmol/L (ref 3.5–5.2)
Sodium: 140 mmol/L (ref 134–144)
Total Protein: 7.2 g/dL (ref 6.0–8.5)

## 2018-07-18 LAB — CBC WITH DIFFERENTIAL/PLATELET
BASOS ABS: 0 10*3/uL (ref 0.0–0.2)
Basos: 0 %
EOS (ABSOLUTE): 0.1 10*3/uL (ref 0.0–0.4)
Eos: 2 %
Hematocrit: 37.8 % (ref 34.0–46.6)
Hemoglobin: 12.2 g/dL (ref 11.1–15.9)
IMMATURE GRANS (ABS): 0 10*3/uL (ref 0.0–0.1)
Immature Granulocytes: 0 %
LYMPHS ABS: 1.7 10*3/uL (ref 0.7–3.1)
LYMPHS: 49 %
MCH: 30.2 pg (ref 26.6–33.0)
MCHC: 32.3 g/dL (ref 31.5–35.7)
MCV: 94 fL (ref 79–97)
Monocytes Absolute: 0.3 10*3/uL (ref 0.1–0.9)
Monocytes: 10 %
NEUTROS ABS: 1.4 10*3/uL (ref 1.4–7.0)
Neutrophils: 39 %
PLATELETS: 149 10*3/uL — AB (ref 150–450)
RBC: 4.04 x10E6/uL (ref 3.77–5.28)
RDW: 12.5 % (ref 12.3–15.4)
WBC: 3.6 10*3/uL (ref 3.4–10.8)

## 2018-07-18 LAB — T4, FREE: FREE T4: 1.01 ng/dL (ref 0.82–1.77)

## 2018-07-18 LAB — VITAMIN D 25 HYDROXY (VIT D DEFICIENCY, FRACTURES): VIT D 25 HYDROXY: 47.7 ng/mL (ref 30.0–100.0)

## 2018-07-18 LAB — TSH: TSH: 2.31 u[IU]/mL (ref 0.450–4.500)

## 2018-07-27 ENCOUNTER — Encounter: Payer: BC Managed Care – PPO | Admitting: Family Medicine

## 2018-09-01 ENCOUNTER — Encounter: Payer: Self-pay | Admitting: Family Medicine

## 2018-09-01 ENCOUNTER — Ambulatory Visit: Payer: BC Managed Care – PPO | Admitting: Family Medicine

## 2018-09-01 VITALS — BP 110/70 | HR 57 | Temp 98.4°F | Resp 16 | Wt 230.0 lb

## 2018-09-01 DIAGNOSIS — N923 Ovulation bleeding: Secondary | ICD-10-CM | POA: Diagnosis not present

## 2018-09-01 DIAGNOSIS — R319 Hematuria, unspecified: Secondary | ICD-10-CM

## 2018-09-01 DIAGNOSIS — J0140 Acute pansinusitis, unspecified: Secondary | ICD-10-CM | POA: Diagnosis not present

## 2018-09-01 LAB — POCT URINALYSIS DIP (PROADVANTAGE DEVICE)
Bilirubin, UA: NEGATIVE
GLUCOSE UA: NEGATIVE mg/dL
Ketones, POC UA: NEGATIVE mg/dL
Nitrite, UA: NEGATIVE
PH UA: 6 (ref 5.0–8.0)
SPECIFIC GRAVITY, URINE: 1.02
UUROB: NEGATIVE

## 2018-09-01 LAB — POCT URINE PREGNANCY: PREG TEST UR: NEGATIVE

## 2018-09-01 MED ORDER — AMOXICILLIN 875 MG PO TABS: 875.0000 mg | ORAL_TABLET | Freq: Two times a day (BID) | ORAL | 0 refills | Status: DC

## 2018-09-01 NOTE — Patient Instructions (Signed)
Take the antibiotic as prescribed, drink plenty of water and rest today.  You should also treat your symptoms with Mucinex, Advil and salt water gargles.   Call or return if not back to baseline after completing the antibiotic.

## 2018-09-01 NOTE — Progress Notes (Signed)
Chief Complaint  Patient presents with  . cough    cough and headache. sinus pressure, voice is lost. starting coughing and caused her sore throat., not coughing up anything. urine is bloody and slimy.     Subjective:  Roberta Galvan is a 32 y.o. female who presents for a one week history of rhinorrhea, nasal congestion, sinus pressure, frontal headache, post nasal drainage, cough, sore throat, and nausea. States she was feeling better and then symptoms worsened yesterday.   Denies fever, chills, chest pain, palpitations, shortness of breath, abdominal, N/V/D.   Treatment to date: Advil.  Denies sick contacts.  No other aggravating or relieving factors.    LMP: last week.   ROS as in subjective.  She also reports seeing blood on the toilet tissue when she wipes. States this is from the vagina.  Denies any urinary symptoms. Is not using contraception.    Objective: Vitals:   09/01/18 1110  BP: 110/70  Pulse: (!) 57  Resp: 16  Temp: 98.4 F (36.9 C)  SpO2: 97%    General appearance: Alert, WD/WN, no distress, mildly ill appearing                             Skin: warm, no rash                           Head: +frontal and maxillary sinus tenderness                            Eyes: conjunctiva normal, corneas clear, PERRLA                            Ears: pearly TMs, external ear canals normal                          Nose: septum midline, turbinates swollen, with erythema and thick yellow discharge             Mouth/throat: MMM, tongue normal, mild pharyngeal erythema                           Neck: supple, no adenopathy, no thyromegaly, nontender                          Heart: RRR, normal S1, S2, no murmurs                         Lungs: CTA bilaterally, no wheezes, rales, or rhonchi      Assessment: Acute non-recurrent pansinusitis - Plan: amoxicillin (AMOXIL) 875 MG tablet  Hematuria, unspecified type - Plan: POCT Urinalysis DIP (Proadvantage Device), POCT urine  pregnancy  Spotting between menses - Plan: POCT urine pregnancy    Plan: Discussed diagnosis and treatment of acute sinusitis and spotting between menses. Negative UPT. Hematuria is related to intermenstrual bleeding and not actually from urethra.   Suggested symptomatic OTC remedies. Amoxil prescribed.  Nasal saline spray for congestion.  Tylenol or Ibuprofen OTC for fever and malaise.  Call/return after completing the antibiotic if symptoms aren't resolved.

## 2019-01-29 ENCOUNTER — Encounter: Payer: Self-pay | Admitting: Family Medicine

## 2019-01-29 ENCOUNTER — Ambulatory Visit (INDEPENDENT_AMBULATORY_CARE_PROVIDER_SITE_OTHER): Payer: BC Managed Care – PPO | Admitting: Family Medicine

## 2019-01-29 VITALS — BP 110/70 | HR 68 | Ht 66.0 in | Wt 242.0 lb

## 2019-01-29 DIAGNOSIS — E559 Vitamin D deficiency, unspecified: Secondary | ICD-10-CM

## 2019-01-29 DIAGNOSIS — Z131 Encounter for screening for diabetes mellitus: Secondary | ICD-10-CM

## 2019-01-29 DIAGNOSIS — Z0000 Encounter for general adult medical examination without abnormal findings: Secondary | ICD-10-CM

## 2019-01-29 DIAGNOSIS — E669 Obesity, unspecified: Secondary | ICD-10-CM | POA: Diagnosis not present

## 2019-01-29 LAB — POCT URINALYSIS DIP (PROADVANTAGE DEVICE)
BILIRUBIN UA: NEGATIVE
Blood, UA: NEGATIVE
GLUCOSE UA: NEGATIVE mg/dL
Ketones, POC UA: NEGATIVE mg/dL
LEUKOCYTES UA: NEGATIVE
NITRITE UA: NEGATIVE
Protein Ur, POC: NEGATIVE mg/dL
Specific Gravity, Urine: 1.03
UUROB: NEGATIVE
pH, UA: 6 (ref 5.0–8.0)

## 2019-01-29 NOTE — Patient Instructions (Signed)
Cut back on carbohydrates (rice, potatoes, bread, pasta) and increase fruits and vegetables. Avoid fried foods and eat more baked, broiled, steamed or grilled foods.  Make sure you are getting at least 150 minutes of brisk physical activity per week.   We will call you with your lab results.       Preventive Care 18-39 Years, Female Preventive care refers to lifestyle choices and visits with your health care provider that can promote health and wellness. What does preventive care include?   A yearly physical exam. This is also called an annual well check.  Dental exams once or twice a year.  Routine eye exams. Ask your health care provider how often you should have your eyes checked.  Personal lifestyle choices, including: ? Daily care of your teeth and gums. ? Regular physical activity. ? Eating a healthy diet. ? Avoiding tobacco and drug use. ? Limiting alcohol use. ? Practicing safe sex. ? Taking vitamin and mineral supplements as recommended by your health care provider. What happens during an annual well check? The services and screenings done by your health care provider during your annual well check will depend on your age, overall health, lifestyle risk factors, and family history of disease. Counseling Your health care provider may ask you questions about your:  Alcohol use.  Tobacco use.  Drug use.  Emotional well-being.  Home and relationship well-being.  Sexual activity.  Eating habits.  Work and work Statistician.  Method of birth control.  Menstrual cycle.  Pregnancy history. Screening You may have the following tests or measurements:  Height, weight, and BMI.  Diabetes screening. This is done by checking your blood sugar (glucose) after you have not eaten for a while (fasting).  Blood pressure.  Lipid and cholesterol levels. These may be checked every 5 years starting at age 46.  Skin check.  Hepatitis C blood test.  Hepatitis B blood  test.  Sexually transmitted disease (STD) testing.  BRCA-related cancer screening. This may be done if you have a family history of breast, ovarian, tubal, or peritoneal cancers.  Pelvic exam and Pap test. This may be done every 3 years starting at age 42. Starting at age 78, this may be done every 5 years if you have a Pap test in combination with an HPV test. Discuss your test results, treatment options, and if necessary, the need for more tests with your health care provider. Vaccines Your health care provider may recommend certain vaccines, such as:  Influenza vaccine. This is recommended every year.  Tetanus, diphtheria, and acellular pertussis (Tdap, Td) vaccine. You may need a Td booster every 10 years.  Varicella vaccine. You may need this if you have not been vaccinated.  HPV vaccine. If you are 67 or younger, you may need three doses over 6 months.  Measles, mumps, and rubella (MMR) vaccine. You may need at least one dose of MMR. You may also need a second dose.  Pneumococcal 13-valent conjugate (PCV13) vaccine. You may need this if you have certain conditions and were not previously vaccinated.  Pneumococcal polysaccharide (PPSV23) vaccine. You may need one or two doses if you smoke cigarettes or if you have certain conditions.  Meningococcal vaccine. One dose is recommended if you are age 18-21 years and a first-year college student living in a residence hall, or if you have one of several medical conditions. You may also need additional booster doses.  Hepatitis A vaccine. You may need this if you have certain conditions or  if you travel or work in places where you may be exposed to hepatitis A.  Hepatitis B vaccine. You may need this if you have certain conditions or if you travel or work in places where you may be exposed to hepatitis B.  Haemophilus influenzae type b (Hib) vaccine. You may need this if you have certain risk factors. Talk to your health care provider  about which screenings and vaccines you need and how often you need them. This information is not intended to replace advice given to you by your health care provider. Make sure you discuss any questions you have with your health care provider. Document Released: 01/04/2002 Document Revised: 06/21/2017 Document Reviewed: 09/09/2015 Elsevier Interactive Patient Education  2019 [employer].

## 2019-01-29 NOTE — Progress Notes (Signed)
Subjective:    Patient ID: Roberta Galvan, female    DOB: 1986/07/10, 33 y.o.   MRN: [id number]  HPI Chief Complaint  Patient presents with  . fasting cpe    fasting cpe   She is here for a complete physical exam. Last CPE: 2017  Other providers: Dentist - [facility]    Lives with husband and 3 kids, works as Advertising copywriter in the dorms at [employer].  Denies smoking, drinking alcohol, drug use  Diet: eats one meal per day in the evening. Heavy in rice.  Excerise: does not exercise   Immunizations: 2019  Health maintenance:  Mammogram: N/A Colonoscopy: N/A Last Gynecological Exam: Pap smear 2017, negative for HPV Last Menstrual cycle: 01/19/2018  Contraception: condoms  Last Dental Exam: twice annually  Last Eye Exam: never. Visual acuity today is fine.   Wears seatbelt always, smoke detectors in home and functioning, does not text while driving and feels safe in home environment.   Reviewed allergies, medications, past medical, surgical, family, and social history.    Review of Systems Review of Systems Constitutional: -fever, -chills, -sweats, -unexpected weight change,-fatigue ENT: -runny nose, -ear pain, -sore throat Cardiology:  -chest pain, -palpitations, -edema Respiratory: -cough, -shortness of breath, -wheezing Gastroenterology: -abdominal pain, -nausea, -vomiting, -diarrhea, -constipation  Hematology: -bleeding or bruising problems Musculoskeletal: -arthralgias, -myalgias, -joint swelling, -back pain Ophthalmology: -vision changes Urology: -dysuria, -difficulty urinating, -hematuria, -urinary frequency, -urgency Neurology: -headache, -weakness, -tingling, -numbness       Objective:   Physical Exam BP 110/70   Pulse 68   Ht 5\' 6"  (1.676 m)   Wt 242 lb (109.8 kg)   LMP 01/15/2019   BMI 39.06 kg/m   General Appearance:    Alert, cooperative, no distress, appears stated age  Head:    Normocephalic, without obvious abnormality, atraumatic  Eyes:     PERRL, conjunctiva/corneas clear, EOM's intact, fundi    benign  Ears:    Normal TM's and external ear canals  Nose:   Nares normal, mucosa normal, no drainage or sinus   tenderness  Throat:   Lips, mucosa, and tongue normal; teeth and gums normal  Neck:   Supple, no lymphadenopathy;  thyroid:  no   enlargement/tenderness/nodules; no carotid   bruit or JVD  Back:    Spine nontender, no curvature, ROM normal, no CVA     tenderness  Lungs:     Clear to auscultation bilaterally without wheezes, rales or     ronchi; respirations unlabored  Chest Wall:    No tenderness or deformity   Heart:    Regular rate and rhythm, S1 and S2 normal, no murmur, rub   or gallop  Breast Exam:    Declines   Abdomen:     Soft, non-tender, nondistended, normoactive bowel sounds,    no masses, no hepatosplenomegaly  Genitalia:    Declines. Pap smear not indicated.   Rectal:    Not performed due to age<40 and no related complaints  Extremities:   No clubbing, cyanosis or edema  Pulses:   2+ and symmetric all extremities  Skin:   Skin color, texture, turgor normal, no rashes or lesions  Lymph nodes:   Cervical, supraclavicular, and axillary nodes normal  Neurologic:   CNII-XII intact, normal strength, sensation and gait; reflexes 2+ and symmetric throughout          Psych:   Normal mood, affect, hygiene and grooming.     Urinalysis dipstick: Negative      Assessment &  Plan:  Routine general medical examination at a health care facility - Plan: CBC with Differential/Platelet, Comprehensive metabolic panel, POCT Urinalysis DIP (Proadvantage Device), TSH, T4, free, T3, Lipid panel  Obesity (BMI 30-39.9) - Plan: Hemoglobin A1c  Vitamin D deficiency - Plan: VITAMIN D 25 Hydroxy (Vit-D Deficiency, Fractures)  Screening for diabetes mellitus - Plan: Hemoglobin A1c  She appears to be doing well physically and emotionally. She has had significant weight gain.  Reportedly her diet has been poor and she has not  been exercising. Counseling done on healthy diet, specifically cutting back on carbohydrates and eating late in the evening.  I also encouraged her to increase her physical activity. Discussed potential long-term health consequences related to obesity. Check vitamin D level and prescribe vitamin D as needed. Immunizations are up-to-date. Pap smear is up-to-date.  We will need to repeat next year. Follow-up pending labs

## 2019-01-30 LAB — CBC WITH DIFFERENTIAL/PLATELET
BASOS ABS: 0 10*3/uL (ref 0.0–0.2)
Basos: 1 %
EOS (ABSOLUTE): 0.1 10*3/uL (ref 0.0–0.4)
Eos: 3 %
HEMATOCRIT: 33.7 % — AB (ref 34.0–46.6)
HEMOGLOBIN: 11.4 g/dL (ref 11.1–15.9)
Immature Grans (Abs): 0 10*3/uL (ref 0.0–0.1)
Immature Granulocytes: 0 %
LYMPHS ABS: 1.6 10*3/uL (ref 0.7–3.1)
Lymphs: 39 %
MCH: 30.9 pg (ref 26.6–33.0)
MCHC: 33.8 g/dL (ref 31.5–35.7)
MCV: 91 fL (ref 79–97)
MONOCYTES: 12 %
Monocytes Absolute: 0.5 10*3/uL (ref 0.1–0.9)
NEUTROS ABS: 1.9 10*3/uL (ref 1.4–7.0)
Neutrophils: 45 %
Platelets: 156 10*3/uL (ref 150–450)
RBC: 3.69 x10E6/uL — ABNORMAL LOW (ref 3.77–5.28)
RDW: 13 % (ref 11.7–15.4)
WBC: 4.2 10*3/uL (ref 3.4–10.8)

## 2019-01-30 LAB — COMPREHENSIVE METABOLIC PANEL
A/G RATIO: 1.8 (ref 1.2–2.2)
ALBUMIN: 4.2 g/dL (ref 3.8–4.8)
ALK PHOS: 52 IU/L (ref 39–117)
ALT: 13 IU/L (ref 0–32)
AST: 17 IU/L (ref 0–40)
BILIRUBIN TOTAL: 0.2 mg/dL (ref 0.0–1.2)
BUN/Creatinine Ratio: 19 (ref 9–23)
BUN: 13 mg/dL (ref 6–20)
CO2: 23 mmol/L (ref 20–29)
CREATININE: 0.7 mg/dL (ref 0.57–1.00)
Calcium: 9.1 mg/dL (ref 8.7–10.2)
Chloride: 104 mmol/L (ref 96–106)
GFR, EST AFRICAN AMERICAN: 131 mL/min/{1.73_m2} (ref >59)
GFR, EST NON AFRICAN AMERICAN: 113 mL/min/{1.73_m2} (ref >59)
GLOBULIN, TOTAL: 2.4 g/dL (ref 1.5–4.5)
GLUCOSE: 84 mg/dL (ref 65–99)
POTASSIUM: 3.9 mmol/L (ref 3.5–5.2)
Sodium: 139 mmol/L (ref 134–144)
Total Protein: 6.6 g/dL (ref 6.0–8.5)

## 2019-01-30 LAB — HEMOGLOBIN A1C
Est. average glucose Bld gHb Est-mCnc: 97 mg/dL
Hgb A1c MFr Bld: 5 % (ref 4.8–5.6)

## 2019-01-30 LAB — VITAMIN D 25 HYDROXY (VIT D DEFICIENCY, FRACTURES): Vit D, 25-Hydroxy: 33.4 ng/mL (ref 30.0–100.0)

## 2019-01-30 LAB — LIPID PANEL
Chol/HDL Ratio: 2.9 ratio (ref 0.0–4.4)
Cholesterol, Total: 153 mg/dL (ref 100–199)
HDL: 52 mg/dL (ref >39)
LDL Calculated: 92 mg/dL (ref 0–99)
Triglycerides: 44 mg/dL (ref 0–149)
VLDL Cholesterol Cal: 9 mg/dL (ref 5–40)

## 2019-01-30 LAB — T4, FREE: Free T4: 0.92 ng/dL (ref 0.82–1.77)

## 2019-01-30 LAB — TSH: TSH: 1.73 u[IU]/mL (ref 0.450–4.500)

## 2019-01-30 LAB — T3: T3, Total: 94 ng/dL (ref 71–180)

## 2019-02-05 ENCOUNTER — Ambulatory Visit: Payer: BC Managed Care – PPO | Admitting: Family Medicine

## 2019-02-05 ENCOUNTER — Encounter: Payer: Self-pay | Admitting: Family Medicine

## 2019-02-05 ENCOUNTER — Other Ambulatory Visit: Payer: Self-pay

## 2019-02-05 VITALS — BP 120/70 | HR 69 | Temp 98.2°F | Resp 16 | Wt 243.2 lb

## 2019-02-05 DIAGNOSIS — J029 Acute pharyngitis, unspecified: Secondary | ICD-10-CM

## 2019-02-05 DIAGNOSIS — R059 Cough, unspecified: Secondary | ICD-10-CM

## 2019-02-05 DIAGNOSIS — R05 Cough: Secondary | ICD-10-CM | POA: Diagnosis not present

## 2019-02-05 DIAGNOSIS — J302 Other seasonal allergic rhinitis: Secondary | ICD-10-CM

## 2019-02-05 LAB — POCT RAPID STREP A (OFFICE): Rapid Strep A Screen: NEGATIVE

## 2019-02-05 NOTE — Patient Instructions (Signed)
Your strep swab is negative. Your symptoms appear to be related to allergies and possibly the common cold.  Recommend that you treat your symptoms by trying an over-the-counter nondrowsy antihistamine such as Claritin, Allegra, Zyrtec or Xyzal. Stay well-hydrated.  You can do salt water gargles for your sore throat.  You can also try Tylenol or ibuprofen if needed.  For chest congestion and cough, if the antihistamine is not working then you can try Mucinex.  You appear to be safe to return to work.

## 2019-02-05 NOTE — Progress Notes (Signed)
Chief Complaint  Patient presents with  . cough    cough, sore throat.no fever, no SOB, no travel.     Subjective:  Roberta Galvan is a 33 y.o. female who presents for a 4-day history of sore throat, dry cough and chest discomfort with coughing. Denies recent travel. Denies sick contacts. States she was coughing at work and they made her leave and schedule a visit.  She has a history of underlying allergies and has taken Zyrtec in the past.  She is not currently taking any allergy medication.  Denies fever, chills, body aches, headache, ear pain, rhinorrhea, nasal congestion, palpitations, shortness of breath, wheezing, abdominal pain, nausea, vomiting, diarrhea.  Treatment to date: DayQuil.   No other aggravating or relieving factors.  No other c/o.  ROS as in subjective.   Objective: Vitals:   02/05/19 1342  BP: 120/70  Pulse: 69  Resp: 16  Temp: 98.2 F (36.8 C)  SpO2: 98%    General appearance: Alert, WD/WN, no distress, well appearing                             Skin: warm, no rash                           Head: no sinus tenderness                            Eyes: conjunctiva normal, corneas clear, PERRLA                            Ears: pearly TMs, external ear canals normal                          Nose: septum midline, turbinates swollen, with erythema and no discharge             Mouth/throat: MMM, tongue normal, mild pharyngeal erythema                           Neck: supple, no adenopathy, no thyromegaly, nontender                          Heart: RRR, normal S1, S2, no murmurs                         Lungs: CTA bilaterally, no wheezes, rales, or rhonchi      Assessment: Acute pharyngitis, unspecified etiology - Plan: Rapid Strep A  Seasonal allergies - Plan: Rapid Strep A  Cough - Plan: Rapid Strep A    Plan: Rapid strep test negative.  Discussed diagnosis and treatment of allergies and viral illness such as the common cold. Very low risk for Covid-19.    Suggested symptomatic OTC remedies. Try antihistamine.  Nasal saline spray for congestion.  Tylenol or Ibuprofen OTC for fever and malaise.  Call/return in 2-3 days if symptoms aren't resolving.

## 2019-08-01 ENCOUNTER — Other Ambulatory Visit: Payer: Self-pay

## 2019-08-01 ENCOUNTER — Ambulatory Visit: Payer: BC Managed Care – PPO | Admitting: Family Medicine

## 2019-08-01 ENCOUNTER — Encounter: Payer: Self-pay | Admitting: Family Medicine

## 2019-08-01 VITALS — BP 120/70 | HR 64 | Temp 97.8°F | Wt 239.0 lb

## 2019-08-01 DIAGNOSIS — M545 Low back pain, unspecified: Secondary | ICD-10-CM

## 2019-08-01 MED ORDER — IBUPROFEN 800 MG PO TABS: 800.0000 mg | ORAL_TABLET | Freq: Three times a day (TID) | ORAL | 0 refills | Status: DC | PRN

## 2019-08-01 NOTE — Patient Instructions (Signed)
Take the ibuprofen 800 mg 3 times daily with food and a full glass of water for the next 2 to 3 days.  You can also use heat or ice to the area 15 to 20 minutes at a time 2-3 times per day.  You may also want to try a topical pain medicine such as icy hot or Biofreeze.  Let me know if you develop any new or worsening symptoms or if you are not improving in the next week or two

## 2019-08-01 NOTE — Progress Notes (Signed)
   Subjective:    Patient ID: Roberta Galvan, female    DOB: 14-Jun-1986, 33 y.o.   MRN: [id number]  HPI Chief Complaint  Patient presents with  . back pain    back pain- yesterday, hurts to turn back, not sure how she hurt back   Complains of midline low back pain that started yesterday morning. Pain feels sharp. Pain is non radiating. Denies injury.  History of similar back pain this time last year.  No history of surgery. Bending forward makes her pain worse. Denies pain when sitting or laying on her back. She does have pain when laying on her stomach.   States she took Motrin 400 mg yesterday and it helped some.  States she missed work yesterday.  States she went to work today but due to the pain she left to come here.  Denies fever, chills, headache, dizziness, chest pain, palpitations, shortness of breath, cough, abdominal pain, N/V/D or constipation.  No numbness, tingling or weakness.   No loss of control of bowels or bladder.   Denies urinary symptoms, vaginal discharge.   LMP: 07/07/2019 Uses condoms.     Review of Systems Pertinent positives and negatives in the history of present illness.     Objective:   Physical Exam BP 120/70   Pulse 64   Temp 97.8 F (36.6 C)   Wt 239 lb (108.4 kg)   BMI 38.58 kg/m   Alert and oriented and in no distress. Neck is supple without adenopathy or thyromegaly. Cardiac exam shows a regular sinus rhythm without murmurs or gallops. Lungs are clear to auscultation.  Abdomen is soft, nondistended, nontender.  Back with normal sensation and motion, no step-off. lumbar midline tenderness.  No spasm.  Negative straight leg raise.  Lower extremities are neurovascularly intact.  Normal gait       Assessment & Plan:  Acute midline low back pain without sciatica - Plan: ibuprofen (ADVIL) 800 MG tablet  No red flag symptoms.  Discussed conservative treatment with NSAIDs, heat and topical analgesic.  Hx of similar pain last year.  Follow  up if not improving or if any new or worsening symptoms.

## 2019-09-05 ENCOUNTER — Encounter: Payer: Self-pay | Admitting: Family Medicine

## 2019-09-05 ENCOUNTER — Ambulatory Visit: Payer: BC Managed Care – PPO | Admitting: Family Medicine

## 2019-09-05 ENCOUNTER — Other Ambulatory Visit: Payer: Self-pay

## 2019-09-05 VITALS — BP 120/72 | HR 94 | Temp 98.4°F | Wt 242.4 lb

## 2019-09-05 DIAGNOSIS — E559 Vitamin D deficiency, unspecified: Secondary | ICD-10-CM | POA: Diagnosis not present

## 2019-09-05 DIAGNOSIS — Z79899 Other long term (current) drug therapy: Secondary | ICD-10-CM | POA: Diagnosis not present

## 2019-09-05 DIAGNOSIS — Z862 Personal history of diseases of the blood and blood-forming organs and certain disorders involving the immune mechanism: Secondary | ICD-10-CM | POA: Diagnosis not present

## 2019-09-05 DIAGNOSIS — M79632 Pain in left forearm: Secondary | ICD-10-CM

## 2019-09-05 DIAGNOSIS — M79631 Pain in right forearm: Secondary | ICD-10-CM

## 2019-09-05 NOTE — Progress Notes (Signed)
   Subjective:    Patient ID: Roberta Galvan, female    DOB: Mar 24, 1986, 33 y.o.   MRN: [id number]  HPI Chief Complaint  Patient presents with  . hand pain    hand adn goes up arm. thinks its her iron getting low as she feels it stretches. was taking iron med but stopped due to conspitation decliunes flu shot   Here with complaints of bilateral forearm pain x 2 days. She has had this issue in the past.  Pain is intermittent. Worse with movement. No pain today. 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 Denies numbness, tingling or weakness.   States usually when her iron is low she has pain like this. Requests that I check her iron level.   Having constipation due to iron pills so she stopped her iron pills 2 weeks ago. Has not been taking a stool softener.   History of vitamin D deficiency and is taking OTC 1,000 IUs daily. Requests to have her vitamin D level checked. States she thinks she needs high dose vitamin D again.   Denies fever, chills, fatigue, dizziness, chest pain, palpitations, shortness of breath, abdominal pain, N/V/D, urinary symptoms.   Reviewed allergies, medications, past medical, surgical, family, and social history.  LMP: 08/07/2019  Review of Systems Pertinent positives and negatives in the history of present illness.     Objective:   Physical Exam BP 120/72   Pulse 94   Temp 98.4 F (36.9 C)   Wt 242 lb 6.4 oz (110 kg)   BMI 39.12 kg/m   Alert and oriented and in no acute distress. Normal elbow, forearm, wrist and hand exams. Denies pain with palpation, movement and strength is equal. Normal sensation, motion. Bilateral upper extremities are neurovascularly intact.       Assessment & Plan:  Vitamin D deficiency - Plan: VITAMIN D 25 Hydroxy (Vit-D Deficiency, Fractures)  History of anemia - Plan: CBC with Differential/Platelet, Iron, TIBC and Ferritin Panel  Pain in both forearms  Medication  management  Unremarkable exam.  If she continues on iron, she should add a stool softener.  Adjust vitamin D supplement as appropriate.  Will check labs and follow up.

## 2019-09-05 NOTE — Patient Instructions (Signed)
If you start back on iron pills, make sure you drink a full glass of water with the iron pill and a stool softener.   We will call you with your lab results.

## 2019-09-06 ENCOUNTER — Ambulatory Visit: Payer: BC Managed Care – PPO | Admitting: Family Medicine

## 2019-09-06 LAB — IRON,TIBC AND FERRITIN PANEL
Ferritin: 46 ng/mL (ref 15–150)
Iron Saturation: 17 % (ref 15–55)
Iron: 51 ug/dL (ref 27–159)
Total Iron Binding Capacity: 295 ug/dL (ref 250–450)
UIBC: 244 ug/dL (ref 131–425)

## 2019-09-06 LAB — CBC WITH DIFFERENTIAL/PLATELET
Basophils Absolute: 0 10*3/uL (ref 0.0–0.2)
Basos: 0 %
EOS (ABSOLUTE): 0.2 10*3/uL (ref 0.0–0.4)
Eos: 4 %
Hematocrit: 37.3 % (ref 34.0–46.6)
Hemoglobin: 12.3 g/dL (ref 11.1–15.9)
Immature Grans (Abs): 0 10*3/uL (ref 0.0–0.1)
Immature Granulocytes: 0 %
Lymphocytes Absolute: 1.8 10*3/uL (ref 0.7–3.1)
Lymphs: 35 %
MCH: 30.4 pg (ref 26.6–33.0)
MCHC: 33 g/dL (ref 31.5–35.7)
MCV: 92 fL (ref 79–97)
Monocytes Absolute: 0.4 10*3/uL (ref 0.1–0.9)
Monocytes: 9 %
Neutrophils Absolute: 2.7 10*3/uL (ref 1.4–7.0)
Neutrophils: 52 %
Platelets: 133 10*3/uL — ABNORMAL LOW (ref 150–450)
RBC: 4.05 x10E6/uL (ref 3.77–5.28)
RDW: 12.5 % (ref 11.7–15.4)
WBC: 5.2 10*3/uL (ref 3.4–10.8)

## 2019-09-06 LAB — VITAMIN D 25 HYDROXY (VIT D DEFICIENCY, FRACTURES): Vit D, 25-Hydroxy: 43.2 ng/mL (ref 30.0–100.0)

## 2019-10-08 ENCOUNTER — Other Ambulatory Visit: Payer: Self-pay

## 2019-10-08 DIAGNOSIS — Z20822 Contact with and (suspected) exposure to covid-19: Secondary | ICD-10-CM

## 2019-10-11 LAB — NOVEL CORONAVIRUS, NAA: SARS-CoV-2, NAA: DETECTED — AB

## 2019-10-12 ENCOUNTER — Encounter: Payer: Self-pay | Admitting: Family Medicine

## 2019-10-12 ENCOUNTER — Ambulatory Visit: Payer: BC Managed Care – PPO | Admitting: Medical

## 2019-10-12 ENCOUNTER — Encounter: Payer: Self-pay | Admitting: Medical

## 2019-10-12 ENCOUNTER — Other Ambulatory Visit: Payer: Self-pay

## 2019-10-12 VITALS — Temp 96.0°F | Ht 66.0 in | Wt 242.0 lb

## 2019-10-12 DIAGNOSIS — R5383 Other fatigue: Secondary | ICD-10-CM | POA: Diagnosis not present

## 2019-10-12 DIAGNOSIS — U071 COVID-19: Secondary | ICD-10-CM | POA: Diagnosis not present

## 2019-10-12 DIAGNOSIS — R05 Cough: Secondary | ICD-10-CM

## 2019-10-12 DIAGNOSIS — R059 Cough, unspecified: Secondary | ICD-10-CM

## 2019-10-12 DIAGNOSIS — R5381 Other malaise: Secondary | ICD-10-CM

## 2019-10-12 MED ORDER — HYDROCODONE-HOMATROPINE 5-1.5 MG/5ML PO SYRP: 5.0000 mL | ORAL_SOLUTION | Freq: Three times a day (TID) | ORAL | 0 refills | Status: AC | PRN

## 2019-10-12 NOTE — Progress Notes (Signed)
Subjective:     Patient ID: Roberta Galvan, female   DOB: 12/09/1985, 33 y.o.   MRN: [id number]  This visit type was conducted due to national recommendations for restrictions regarding the COVID-19 Pandemic (e.g. social distancing) in an effort to limit this patient's exposure and mitigate transmission in our community.  Due to their co-morbid illnesses, this patient is at least at moderate risk for complications without adequate follow up.  This format is felt to be most appropriate for this patient at this time.    Documentation for virtual audio and video telecommunications through Zoom encounter:  The patient was located at home. The provider was located in the office. The patient did consent to this visit and is aware of possible charges through their insurance for this visit.  The other persons participating in this telemedicine service were none. Time spent on call was 20 minutes and in review of previous records 20 minutes total.  This virtual service is not related to other E/M service within previous 7 days.    HPI Chief Complaint  Patient presents with  . Cough    sore throat, headache,loss of smell and taste-neg covid x 10/08/19   Virtual consult today for respiratory symptoms that began 5 days ago with sore throat, headache, loss of smell and taste, a little cough, but at night a lot cough.  No nausea, no vomiting.  No loose stool.  Has pain in chest, no SOB.  Not a lot of water intake.   Is urinating ok ,normal.  No sick contacts.   Had Covid test Monday the same day her symptoms started.   No one in household has been sick.   Some body aches.  No chills.   Using OTC cough drop.  Had used some ibuprofen.  Switched to tylenol.   Lives with husband and young children.  She has been careful to quarantine in another room at home as the kids do not have symptoms.  Husband has had symtpoms has been trying to quarantine as well.    No other aggravating or relieving factors. No other  complaint.  Past Medical History:  Diagnosis Date  . Complication of anesthesia    NUMBNESS IN  LEGS  . Headache behind the eyes   . History of anemia   . No pertinent past medical history   . Vitamin D deficiency    Current Outpatient Medications on File Prior to Visit  Medication Sig Dispense Refill  . cholecalciferol (VITAMIN D) 1000 units tablet Take 1,000 Units by mouth daily.    . ferrous sulfate 325 (65 FE) MG tablet Take 1 tablet (325 mg total) by mouth daily with breakfast. 30 tablet 2  . ibuprofen (ADVIL) 800 MG tablet Take 1 tablet (800 mg total) by mouth every 8 (eight) hours as needed. 30 tablet 0   No current facility-administered medications on file prior to visit.     Review of Systems As in subjective    Objective:   Physical Exam Due to coronavirus pandemic stay at home measures, patient visit was virtual and they were not examined in person.   Temp (!) 96 F (35.6 C)   Ht 5\' 6"  (1.676 m)   Wt 242 lb (109.8 kg)   BMI 39.06 kg/m  Wt Readings from Last 3 Encounters:  10/12/19 242 lb (109.8 kg)  09/05/19 242 lb 6.4 oz (110 kg)  08/01/19 239 lb (108.4 kg)        Assessment:  Encounter Diagnoses  Name Primary?  . COVID-19 virus infection Yes  . Cough   . Malaise and fatigue        Plan:     Unfortunately you tested positive for Covid  Recommendations  REST  Hydrate well with water, other clear fluids such as soup broth, ice chips, G2 Gatorade or other clear liquids throughout the day to stay hydrated  Try to drink at least a liter - 1.5 liters of water per day  I will send a  medication to your pharmacy called Hycodan cough syrup that you can use for cough and headache.   You can use this as directed on the label.  If you need to take Tylenol for headache or fever, alternate this by at least 4 hours with the Hycodan cough syrup  You can use salt water gargles, warm fluids, such as tea and coffee, and for Chloraseptic over-the-counter  sore throat spray for sore throat pain   Ilynn was seen today for cough.  Diagnoses and all orders for this visit:  COVID-19 virus infection  Cough  Malaise and fatigue  Other orders -     HYDROcodone-homatropine (HYCODAN) 5-1.5 MG/5ML syrup; Take 5 mLs by mouth every 8 (eight) hours as needed for up to 5 days.

## 2019-10-12 NOTE — Progress Notes (Signed)
done

## 2019-10-12 NOTE — Patient Instructions (Signed)
Unfortunately you tested positive for Covid  Recommendations  REST  Hydrate well with water, other clear fluids such as soup broth, ice chips, G2 Gatorade or other clear liquids throughout the day to stay hydrated  Try to drink at least a liter - 1.5 liters of water per day  I will send a  medication to your pharmacy called Hycodan cough syrup that you can use for cough and headache.   You can use this as directed on the label.  If you need to take Tylenol for headache or fever, alternate this by at least 4 hours with the Hycodan cough syrup  You can use salt water gargles, warm fluids, such as tea and coffee, and for Chloraseptic over-the-counter sore throat spray for sore throat pain    If you are having trouble breathing, if you are very weak, have high fever 103 or higher consistently despite Tylenol, or uncontrollable nausea and vomiting, then call or go to the emergency department.    If you have other questions or have other symptoms or questions you are concerned about then please call back or send my chart message  Covid symptoms such as fatigue and cough can linger over 2 weeks, even after the initial fever, aches, chills, and other initial symptoms.   Self Quarantine: The CDC, Centers for Disease Control has recommended a self quarantine of 10 days from the start of your illness until you are symptom-free including at least 24 hours of no symptoms including no fever, no shortness of breath, and no body aches and chills, by day 10 before returning to work or general contact with the public.  What does self quarantine mean: avoiding contact with people as much as possible.   Particularly in your house, isolate your self from others in a separate room, wear a mask when possible in the room, particularly if coughing a lot.   Have others bring food, water, medications, etc., to your door, but avoid direct contact with your household contacts during this time to avoid spreading the  infection to them.   If you have a separate bathroom and living quarters during the next 2 weeks away from others, that would be preferable.    If you can't completely isolate, then wear a mask, wash hands frequently with soap and water for at least 15 seconds, minimize close contact with others, and have a friend or family member check regularly from a distance to make sure you are not getting seriously worse.     You should not be going out in public, should not be going to stores, to work or other public places until all your symptoms have resolved and at least 10 days + 24 hours of no symptoms at all have transpired.   Ideally you should avoid contact with others for a full 10 days if possible.  One of the goals is to limit spread to high risk people; people that are older and elderly, people with multiple health issues like diabetes, heart disease, lung disease, and anybody that has weakened immune systems such as people with cancer or on immunosuppressive therapy.

## 2019-10-29 ENCOUNTER — Encounter: Payer: Self-pay | Admitting: Family Medicine

## 2019-10-29 ENCOUNTER — Other Ambulatory Visit: Payer: Self-pay

## 2019-10-29 ENCOUNTER — Ambulatory Visit: Payer: BC Managed Care – PPO | Admitting: Family Medicine

## 2019-10-29 ENCOUNTER — Ambulatory Visit: Payer: BC Managed Care – PPO | Admitting: Medical

## 2019-10-29 VITALS — Wt 242.0 lb

## 2019-10-29 DIAGNOSIS — Z8619 Personal history of other infectious and parasitic diseases: Secondary | ICD-10-CM

## 2019-10-29 DIAGNOSIS — Z8616 Personal history of COVID-19: Secondary | ICD-10-CM

## 2019-10-29 NOTE — Progress Notes (Signed)
   Subjective:  Documentation for virtual audio only due to patient not having video access with a smartphone.   The patient was located at home. 2 patient identifiers used.  The provider was located in the office. The patient did consent to this visit and is aware of possible charges through their insurance for this visit.  The other persons participating in this telemedicine service were none.    Patient ID: Roberta Galvan, female    DOB: 1986-07-06, 33 y.o.   MRN: [id number]  HPI Chief Complaint  Patient presents with  . Follow-up    follow-up on covid- chills/ cold and headache in afternoons. needs work note to go back to work. been out since 11/16   This is a follow up on recent Covid-19 illness. States she needs a return to work note.  States she is approximately 80% improved.  Her only symptom is feeling cold/chilly in the evenings.  Denies fever, body aches, headache, dizziness, chest pain, cough, shortness of breath, abdominal pain, N/V/D.    She works at [employer] in [place] and thinks she got it there. Her husband also was positive and was sick.  Positive Covid-19 test on 10/08/2019.   No other concerns today.   Reviewed allergies, medications, past medical, surgical, family, and social history.    Review of Systems Pertinent positives and negatives in the history of present illness.     Objective:   Physical Exam Wt 242 lb (109.8 kg)   BMI 39.06 kg/m   Alert and oriented and in no acute distress. Respirations unlabored, speaking in complete sentences without difficulty. Normal mood and thought process.      Assessment & Plan:  History of 2019 novel coronavirus disease (COVID-19)  Reviewed her recent visit notes with Dorothea Ogle, PA and her lab results.  She is reportedly 80% back to baseline. States she has been out of work since 10/08/2019 and she and her boss have talked and she plans to return to work on Wednesday 10/31/2019. I will provide the  note for her. She will follow up if any new or worsening symptoms.   Time spent on call was 12 minutes and in review of previous records 15 minutes total.  This virtual service is not related to other E/M service within previous 7 days.

## 2019-11-23 NOTE — L&D Delivery Note (Signed)
Delivery Note Seniya Seybold is a [id number] at [redacted]w[redacted]d who had a spontaneous delivery at 19:47 on 10/13/20 a viable female "Malachi" was delivered via ROA.  APGAR: 7, 9; weight 3685g (8lb2oz).  Admitted for IOL 2/2 AMA. Induced with cytotec, pitocin, AROM. Prolonged active phase, 7cm at 11:40 and then 10cm at 18:51. Received an epidural for pain management. Pushed for 56 minutes. Baby was delivered without difficulty. No nuchal cord. Baby placed on maternal abdomen. Delayed cord clamping for 6- seconds. Delivery of placenta was spontaneous. Placenta was found to be intact, marginal cord insertion, 3-vessel cord was noted. The fundus was found to be firm and the lower uterine segmant was cleared of clot x1. Patient received 1000 mcg of rectal miso and 0.2mg  for lower uterine segment atony, improved after medication and massage.  No lacerations. Estimated blood loss 100cc.  Instrument and gauze counts were correct at the end of the procedure.  Placenta status: L&D Mom to postpartum.  Baby to Couplet care / Skin to Skin.  Dakotah Orrego K Taam-Akelman 10/13/2020, 8:00 PM

## 2020-03-06 LAB — OB RESULTS CONSOLE GC/CHLAMYDIA
Chlamydia: NEGATIVE
Gonorrhea: NEGATIVE

## 2020-03-06 LAB — OB RESULTS CONSOLE HIV ANTIBODY (ROUTINE TESTING): HIV: NONREACTIVE

## 2020-03-06 LAB — OB RESULTS CONSOLE PLATELET COUNT: Platelets: 158

## 2020-03-06 LAB — OB RESULTS CONSOLE HEPATITIS B SURFACE ANTIGEN: Hepatitis B Surface Ag: NEGATIVE

## 2020-03-06 LAB — OB RESULTS CONSOLE RPR: RPR: NONREACTIVE

## 2020-03-06 LAB — OB RESULTS CONSOLE HGB/HCT, BLOOD: Hemoglobin: 11.9

## 2020-03-06 LAB — OB RESULTS CONSOLE RUBELLA ANTIBODY, IGM: Rubella: IMMUNE

## 2020-07-16 ENCOUNTER — Encounter (HOSPITAL_COMMUNITY): Payer: Self-pay | Admitting: *Deleted

## 2020-07-16 ENCOUNTER — Inpatient Hospital Stay (HOSPITAL_COMMUNITY)
Admission: AD | Admit: 2020-07-16 | Discharge: 2020-07-17 | Disposition: A | Discharge status: Home / Self Care | Payer: BC Managed Care – PPO | Attending: Obstetrics and Gynecology | Admitting: Obstetrics and Gynecology

## 2020-07-16 DIAGNOSIS — R109 Unspecified abdominal pain: Secondary | ICD-10-CM | POA: Insufficient documentation

## 2020-07-16 DIAGNOSIS — Z3A27 27 weeks gestation of pregnancy: Secondary | ICD-10-CM | POA: Insufficient documentation

## 2020-07-16 DIAGNOSIS — O26892 Other specified pregnancy related conditions, second trimester: Secondary | ICD-10-CM | POA: Insufficient documentation

## 2020-07-16 LAB — URINALYSIS, ROUTINE W REFLEX MICROSCOPIC
Bilirubin Urine: NEGATIVE
Glucose, UA: 50 mg/dL — AB
Hgb urine dipstick: NEGATIVE
Ketones, ur: NEGATIVE mg/dL
Nitrite: NEGATIVE
Protein, ur: NEGATIVE mg/dL
Specific Gravity, Urine: 1.009 (ref 1.005–1.030)
pH: 7 (ref 5.0–8.0)

## 2020-07-16 NOTE — MAU Note (Signed)
Pain in lower abd since this afternoon. Pain comes and goes and feels like ctxs. Denies LOF or VB.

## 2020-07-17 DIAGNOSIS — O26892 Other specified pregnancy related conditions, second trimester: Secondary | ICD-10-CM

## 2020-07-17 DIAGNOSIS — Z3A27 27 weeks gestation of pregnancy: Secondary | ICD-10-CM | POA: Diagnosis not present

## 2020-07-17 DIAGNOSIS — R109 Unspecified abdominal pain: Secondary | ICD-10-CM

## 2020-07-17 LAB — COMPREHENSIVE METABOLIC PANEL
ALT: 16 U/L (ref 0–44)
AST: 21 U/L (ref 15–41)
Albumin: 2.9 g/dL — ABNORMAL LOW (ref 3.5–5.0)
Alkaline Phosphatase: 49 U/L (ref 38–126)
Anion gap: 8 (ref 5–15)
BUN: 10 mg/dL (ref 6–20)
CO2: 21 mmol/L — ABNORMAL LOW (ref 22–32)
Calcium: 8.9 mg/dL (ref 8.9–10.3)
Chloride: 105 mmol/L (ref 98–111)
Creatinine, Ser: 0.7 mg/dL (ref 0.44–1.00)
GFR calc Af Amer: >60 mL/min (ref >60)
GFR calc non Af Amer: >60 mL/min (ref >60)
Glucose, Bld: 96 mg/dL (ref 70–99)
Potassium: 3.4 mmol/L — ABNORMAL LOW (ref 3.5–5.1)
Sodium: 134 mmol/L — ABNORMAL LOW (ref 135–145)
Total Bilirubin: <0.1 mg/dL — ABNORMAL LOW (ref 0.3–1.2)
Total Protein: 5.9 g/dL — ABNORMAL LOW (ref 6.5–8.1)

## 2020-07-17 LAB — CBC WITH DIFFERENTIAL/PLATELET
Abs Immature Granulocytes: 0.03 10*3/uL (ref 0.00–0.07)
Basophils Absolute: 0 10*3/uL (ref 0.0–0.1)
Basophils Relative: 0 %
Eosinophils Absolute: 0.1 10*3/uL (ref 0.0–0.5)
Eosinophils Relative: 2 %
HCT: 33.4 % — ABNORMAL LOW (ref 36.0–46.0)
Hemoglobin: 10.9 g/dL — ABNORMAL LOW (ref 12.0–15.0)
Immature Granulocytes: 1 %
Lymphocytes Relative: 24 %
Lymphs Abs: 1.5 10*3/uL (ref 0.7–4.0)
MCH: 31.3 pg (ref 26.0–34.0)
MCHC: 32.6 g/dL (ref 30.0–36.0)
MCV: 96 fL (ref 80.0–100.0)
Monocytes Absolute: 0.7 10*3/uL (ref 0.1–1.0)
Monocytes Relative: 11 %
Neutro Abs: 3.8 10*3/uL (ref 1.7–7.7)
Neutrophils Relative %: 62 %
Platelets: 146 10*3/uL — ABNORMAL LOW (ref 150–400)
RBC: 3.48 MIL/uL — ABNORMAL LOW (ref 3.87–5.11)
RDW: 13.3 % (ref 11.5–15.5)
WBC: 6.1 10*3/uL (ref 4.0–10.5)
nRBC: 0 % (ref 0.0–0.2)

## 2020-07-17 LAB — FETAL FIBRONECTIN: Fetal Fibronectin: NEGATIVE

## 2020-07-17 MED ORDER — HYOSCYAMINE SULFATE 0.125 MG SL SUBL
0.2500 mg | SUBLINGUAL_TABLET | Freq: Once | SUBLINGUAL | Status: AC
  Administered 2020-07-17: 0.25 mg via SUBLINGUAL
  Filled 2020-07-17: qty 2

## 2020-07-17 MED ORDER — SIMETHICONE 80 MG PO CHEW
80.0000 mg | CHEWABLE_TABLET | Freq: Once | ORAL | Status: AC
  Administered 2020-07-17: 80 mg via ORAL
  Filled 2020-07-17: qty 1

## 2020-07-17 NOTE — MAU Provider Note (Signed)
Chief Complaint:  Abdominal Pain   First Provider Initiated Contact with Patient 07/17/20 0017     HPI: Roberta Galvan is a 34 y.o. G4P3003 at 83w0dwho presents to maternity admissions reporting intermittent abdominal pain.  Points to periumbilical area when asked. Comes and goes like cramping. . She reports good fetal movement, denies LOF, vaginal bleeding, vaginal itching/burning, urinary symptoms, h/a, dizziness, n/v, diarrhea, constipation or fever/chills. .  Abdominal Pain This is a new problem. The current episode started today. The onset quality is gradual. The problem occurs intermittently. The problem has been unchanged. The pain is located in the generalized abdominal region and periumbilical region. The pain is mild. The quality of the pain is colicky and cramping. The abdominal pain does not radiate. Pertinent negatives include no anorexia, constipation, diarrhea, dysuria, fever, frequency, myalgias, nausea or vomiting. Nothing aggravates the pain. The pain is relieved by nothing. She has tried nothing for the symptoms.    RN Note: Pain in lower abd since this afternoon. Pain comes and goes and feels like ctxs. Denies LOF or VB.   Past Medical History: Past Medical History:  Diagnosis Date  . Complication of anesthesia    NUMBNESS IN  LEGS  . Headache behind the eyes   . History of anemia   . No pertinent past medical history   . Vitamin D deficiency     Past obstetric history: OB History  Gravida Para Term Preterm AB Living  4 3 3     3   SAB TAB Ectopic Multiple Live Births          3    # Outcome Date GA Lbr Len/2nd Weight Sex Delivery Anes PTL Lv  4 Current           3 Term 12/29/13 [redacted]w[redacted]d 12:40 / 00:11 2835 g M Vag-Vacuum EPI  LIV  2 Term 12/31/11 [redacted]w[redacted]d 07:42 / 00:19 3396 g F Vag-Spont EPI  LIV  1 Term 2008 [redacted]w[redacted]d 24:00 3374 g F Vag-Spont EPI  LIV    Past Surgical History: Past Surgical History:  Procedure Laterality Date  . NO PAST SURGERIES      Family  History: History reviewed. No pertinent family history.  Social History: Social History   Tobacco Use  . Smoking status: Never Smoker  . Smokeless tobacco: Never Used  Vaping Use  . Vaping Use: Never used  Substance Use Topics  . Alcohol use: No  . Drug use: No    Allergies: No Known Allergies  Meds:  Medications Prior to Admission  Medication Sig Dispense Refill Last Dose  . aspirin 81 MG chewable tablet Chew 81 mg by mouth daily.   07/16/2020 at Unknown time  . Prenatal Vit-Fe Fumarate-FA (MULTIVITAMIN-PRENATAL) 27-0.8 MG TABS tablet Take 1 tablet by mouth daily at 12 noon.   07/16/2020 at Unknown time  . cholecalciferol (VITAMIN D) 1000 units tablet Take 1,000 Units by mouth daily.     . ferrous sulfate 325 (65 FE) MG tablet Take 1 tablet (325 mg total) by mouth daily with breakfast. 30 tablet 2   . ibuprofen (ADVIL) 800 MG tablet Take 1 tablet (800 mg total) by mouth every 8 (eight) hours as needed. 30 tablet 0     I have reviewed patient's Past Medical Hx, Surgical Hx, Family Hx, Social Hx, medications and allergies.   ROS:  Review of Systems  Constitutional: Negative for fever.  Gastrointestinal: Positive for abdominal pain. Negative for anorexia, constipation, diarrhea, nausea and vomiting.  Genitourinary: Negative  for dysuria and frequency.  Musculoskeletal: Negative for myalgias.   Other systems negative  Physical Exam   Patient Vitals for the past 24 hrs:  BP Temp Pulse Resp Height Weight  07/16/20 2247 126/66 -- 87 -- -- --  07/16/20 2242 -- 98 F (36.7 C) -- 18 5\' 6"  (1.676 m) 125.6 kg   Constitutional: Well-developed, well-nourished female in no acute distress.  Cardiovascular: normal rate and rhythm Respiratory: normal effort, clear to auscultation bilaterally GI: Abd soft, non-tender, gravid appropriate for gestational age.   No rebound or guarding. MS: Extremities nontender, no edema, normal ROM Neurologic: Alert and oriented x 4.  GU: Neg  CVAT.  PELVIC EXAM:   Cervix long and closed   Fetal fibronectin collected  FHT:  Baseline 130 , moderate variability, accelerations present, no decelerations Contractions: uterine irritabilty   Labs: Results for orders placed or performed during the hospital encounter of 07/16/20 (from the past 24 hour(s))  Urinalysis, Routine w reflex microscopic Urine, Clean Catch     Status: Abnormal   Collection Time: 07/16/20 10:56 PM  Result Value Ref Range   Color, Urine YELLOW YELLOW   APPearance CLEAR CLEAR   Specific Gravity, Urine 1.009 1.005 - 1.030   pH 7.0 5.0 - 8.0   Glucose, UA 50 (A) NEGATIVE mg/dL   Hgb urine dipstick NEGATIVE NEGATIVE   Bilirubin Urine NEGATIVE NEGATIVE   Ketones, ur NEGATIVE NEGATIVE mg/dL   Protein, ur NEGATIVE NEGATIVE mg/dL   Nitrite NEGATIVE NEGATIVE   Leukocytes,Ua SMALL (A) NEGATIVE   WBC, UA 6-10 0 - 5 WBC/hpf   Bacteria, UA RARE (A) NONE SEEN   Squamous Epithelial / LPF 6-10 0 - 5  Fetal fibronectin     Status: None   Collection Time: 07/17/20 12:25 AM  Result Value Ref Range   Fetal Fibronectin NEGATIVE NEGATIVE  CBC with Differential/Platelet     Status: Abnormal   Collection Time: 07/17/20 12:34 AM  Result Value Ref Range   WBC 6.1 4.0 - 10.5 K/uL   RBC 3.48 (L) 3.87 - 5.11 MIL/uL   Hemoglobin 10.9 (L) 12.0 - 15.0 g/dL   HCT 07/19/20 (L) 36 - 46 %   MCV 96.0 80.0 - 100.0 fL   MCH 31.3 26.0 - 34.0 pg   MCHC 32.6 30.0 - 36.0 g/dL   RDW 16.1 09.6 - 04.5 %   Platelets 146 (L) 150 - 400 K/uL   nRBC 0.0 0.0 - 0.2 %   Neutrophils Relative % 62 %   Neutro Abs 3.8 1.7 - 7.7 K/uL   Lymphocytes Relative 24 %   Lymphs Abs 1.5 0.7 - 4.0 K/uL   Monocytes Relative 11 %   Monocytes Absolute 0.7 0 - 1 K/uL   Eosinophils Relative 2 %   Eosinophils Absolute 0.1 0 - 0 K/uL   Basophils Relative 0 %   Basophils Absolute 0.0 0 - 0 K/uL   Immature Granulocytes 1 %   Abs Immature Granulocytes 0.03 0.00 - 0.07 K/uL  Comprehensive metabolic panel     Status:  Abnormal   Collection Time: 07/17/20 12:34 AM  Result Value Ref Range   Sodium 134 (L) 135 - 145 mmol/L   Potassium 3.4 (L) 3.5 - 5.1 mmol/L   Chloride 105 98 - 111 mmol/L   CO2 21 (L) 22 - 32 mmol/L   Glucose, Bld 96 70 - 99 mg/dL   BUN 10 6 - 20 mg/dL   Creatinine, Ser 07/19/20 0.44 - 1.00 mg/dL  Calcium 8.9 8.9 - 10.3 mg/dL   Total Protein 5.9 (L) 6.5 - 8.1 g/dL   Albumin 2.9 (L) 3.5 - 5.0 g/dL   AST 21 15 - 41 U/L   ALT 16 0 - 44 U/L   Alkaline Phosphatase 49 38 - 126 U/L   Total Bilirubin <0.1 (L) 0.3 - 1.2 mg/dL   GFR calc non Af Amer >60 >60 mL/min   GFR calc Af Amer >60 >60 mL/min   Anion gap 8 5 - 15    Imaging:  No results found.  MAU Course/MDM: I have ordered labs and reviewed results. No leukocytosis so doubt appendicitis.  Normal chemistries.  Fetal fibronectin is negative. NST reviewed, reassuring.  Treatments in MAU included Levsin and simethicone given which completely relieved her pain..  Discussed the pain was likely intestinal cramping and not contractions.  Neg FFn is reassuring, as was cervical exam  Assessment: SIngle IUP at [redacted]w[redacted]d Colicky abdominal cramping  Plan: Discharge home Recommend hydration andhigh fiber diet Preterm Labor precautions and fetal kick counts Follow up in Office for prenatal visits   Encouraged to return here or to other Urgent Care/ED if she develops worsening of symptoms, increase in pain, fever, or other concerning symptoms.   Pt stable at time of discharge.  Wynelle Bourgeois CNM, MSN Certified Nurse-Midwife 07/17/2020 12:17 AM

## 2020-07-17 NOTE — Discharge Instructions (Signed)
Abdominal Pain During Pregnancy  Abdominal pain is common during pregnancy, and has many possible causes. Some causes are more serious than others, and sometimes the cause is not known. Abdominal pain can be a sign that labor is starting. It can also be caused by normal growth and stretching of muscles and ligaments during pregnancy. Always tell your health care provider if you have any abdominal pain. Follow these instructions at home:  Do not have sex or put anything in your vagina until your pain goes away completely.  Get plenty of rest until your pain improves.  Drink enough fluid to keep your urine pale yellow.  Take over-the-counter and prescription medicines only as told by your health care provider.  Keep all follow-up visits as told by your health care provider. This is important. Contact a health care provider if:  Your pain continues or gets worse after resting.  You have lower abdominal pain that: ? Comes and goes at regular intervals. ? Spreads to your back. ? Is similar to menstrual cramps.  You have pain or burning when you urinate. Get help right away if:  You have a fever or chills.  You have vaginal bleeding.  You are leaking fluid from your vagina.  You are passing tissue from your vagina.  You have vomiting or diarrhea that lasts for more than 24 hours.  Your baby is moving less than usual.  You feel very weak or faint.  You have shortness of breath.  You develop severe pain in your upper abdomen. Summary  Abdominal pain is common during pregnancy, and has many possible causes.  If you experience abdominal pain during pregnancy, tell your health care provider right away.  Follow your health care provider's home care instructions and keep all follow-up visits as directed. This information is not intended to replace advice given to you by your health care provider. Make sure you discuss any questions you have with your health care  provider. Document Revised: 02/26/2019 Document Reviewed: 02/10/2017 Elsevier Patient Education  2020 Elsevier Inc.  

## 2020-07-25 LAB — OB RESULTS CONSOLE RPR: RPR: NONREACTIVE

## 2020-09-23 LAB — OB RESULTS CONSOLE GBS: GBS: NEGATIVE

## 2020-10-02 ENCOUNTER — Other Ambulatory Visit: Payer: Self-pay | Admitting: Obstetrics and Gynecology

## 2020-10-02 ENCOUNTER — Encounter (HOSPITAL_COMMUNITY): Payer: Self-pay | Admitting: *Deleted

## 2020-10-02 ENCOUNTER — Telehealth (HOSPITAL_COMMUNITY): Payer: Self-pay | Admitting: *Deleted

## 2020-10-02 NOTE — Telephone Encounter (Signed)
Preadmission screen  

## 2020-10-04 ENCOUNTER — Encounter (HOSPITAL_COMMUNITY): Payer: Self-pay | Admitting: Obstetrics and Gynecology

## 2020-10-04 ENCOUNTER — Inpatient Hospital Stay (HOSPITAL_COMMUNITY)
Admission: AD | Admit: 2020-10-04 | Discharge: 2020-10-05 | Disposition: A | Discharge status: Home / Self Care | Payer: BC Managed Care – PPO | Attending: Obstetrics and Gynecology | Admitting: Obstetrics and Gynecology

## 2020-10-04 ENCOUNTER — Other Ambulatory Visit: Payer: Self-pay

## 2020-10-04 DIAGNOSIS — O471 False labor at or after 37 completed weeks of gestation: Secondary | ICD-10-CM

## 2020-10-04 DIAGNOSIS — Z3A38 38 weeks gestation of pregnancy: Secondary | ICD-10-CM | POA: Insufficient documentation

## 2020-10-04 DIAGNOSIS — Z3689 Encounter for other specified antenatal screening: Secondary | ICD-10-CM | POA: Insufficient documentation

## 2020-10-04 NOTE — MAU Note (Signed)
Reports to MAU complaining of CTX very regularly that began earlier today but have gotten worse. +FM. Denies vaginal bleeding or LOF.  Last cervical exam on Thursday 0.5cm.

## 2020-10-05 DIAGNOSIS — Z3689 Encounter for other specified antenatal screening: Secondary | ICD-10-CM | POA: Diagnosis not present

## 2020-10-05 DIAGNOSIS — O471 False labor at or after 37 completed weeks of gestation: Secondary | ICD-10-CM | POA: Diagnosis not present

## 2020-10-05 DIAGNOSIS — Z3A38 38 weeks gestation of pregnancy: Secondary | ICD-10-CM | POA: Diagnosis not present

## 2020-10-05 NOTE — Discharge Instructions (Signed)
Braxton Hicks Contractions °Contractions of the uterus can occur throughout pregnancy, but they are not always a sign that you are in labor. You may have practice contractions called Braxton Hicks contractions. These false labor contractions are sometimes confused with true labor. °What are Braxton Hicks contractions? °Braxton Hicks contractions are tightening movements that occur in the muscles of the uterus before labor. Unlike true labor contractions, these contractions do not result in opening (dilation) and thinning of the cervix. Toward the end of pregnancy (32-34 weeks), Braxton Hicks contractions can happen more often and may become stronger. These contractions are sometimes difficult to tell apart from true labor because they can be very uncomfortable. You should not feel embarrassed if you go to the hospital with false labor. °Sometimes, the only way to tell if you are in true labor is for your health care provider to look for changes in the cervix. The health care provider will do a physical exam and may monitor your contractions. If you are not in true labor, the exam should show that your cervix is not dilating and your water has not broken. °If there are no other health problems associated with your pregnancy, it is completely safe for you to be sent home with false labor. You may continue to have Braxton Hicks contractions until you go into true labor. °How to tell the difference between true labor and false labor °True labor °· Contractions last 30-70 seconds. °· Contractions become very regular. °· Discomfort is usually felt in the top of the uterus, and it spreads to the lower abdomen and low back. °· Contractions do not go away with walking. °· Contractions usually become more intense and increase in frequency. °· The cervix dilates and gets thinner. °False labor °· Contractions are usually shorter and not as strong as true labor contractions. °· Contractions are usually irregular. °· Contractions  are often felt in the front of the lower abdomen and in the groin. °· Contractions may go away when you walk around or change positions while lying down. °· Contractions get weaker and are shorter-lasting as time goes on. °· The cervix usually does not dilate or become thin. °Follow these instructions at home: ° °· Take over-the-counter and prescription medicines only as told by your health care provider. °· Keep up with your usual exercises and follow other instructions from your health care provider. °· Eat and drink lightly if you think you are going into labor. °· If Braxton Hicks contractions are making you uncomfortable: °? Change your position from lying down or resting to walking, or change from walking to resting. °? Sit and rest in a tub of warm water. °? Drink enough fluid to keep your urine pale yellow. Dehydration may cause these contractions. °? Do slow and deep breathing several times an hour. °· Keep all follow-up prenatal visits as told by your health care provider. This is important. °Contact a health care provider if: °· You have a fever. °· You have continuous pain in your abdomen. °Get help right away if: °· Your contractions become stronger, more regular, and closer together. °· You have fluid leaking or gushing from your vagina. °· You pass blood-tinged mucus (bloody show). °· You have bleeding from your vagina. °· You have low back pain that you never had before. °· You feel your baby’s head pushing down and causing pelvic pressure. °· Your baby is not moving inside you as much as it used to. °Summary °· Contractions that occur before labor are   called Braxton Hicks contractions, false labor, or practice contractions. °· Braxton Hicks contractions are usually shorter, weaker, farther apart, and less regular than true labor contractions. True labor contractions usually become progressively stronger and regular, and they become more frequent. °· Manage discomfort from Braxton Hicks contractions  by changing position, resting in a warm bath, drinking plenty of water, or practicing deep breathing. °This information is not intended to replace advice given to you by your health care provider. Make sure you discuss any questions you have with your health care provider. °Document Revised: 10/21/2017 Document Reviewed: 03/24/2017 °Elsevier Patient Education © 2020 Elsevier Inc. ° °

## 2020-10-05 NOTE — MAU Provider Note (Signed)
First Provider Initiated Contact with Patient 10/05/20 0115     S: Ms. Roberta Galvan is a 34 y.o. [phone] at [redacted]w[redacted]d  who presents to MAU today complaining contractions q 3-8 minutes since 11am 10/04/20. She denies vaginal bleeding. She denies LOF. She reports normal fetal movement.    O: BP 130/72   Pulse 83   Temp 98 F (36.7 C) (Oral)   Resp 18   Ht 5\' 6"  (1.676 m)   Wt 289 lb 3.2 oz (131.2 kg)   SpO2 100%   BMI 46.68 kg/m  GENERAL: Well-developed, well-nourished female in no acute distress.  HEAD: Normocephalic, atraumatic.  CHEST: Normal effort of breathing, regular heart rate ABDOMEN: Soft, nontender, gravid  1st Cervical exam: Dilation: external os 1 Effacement: thick Cervical position: posterior Station: ballotable Presentation: indeterminate  2nd Cervical exam:  Dilation: Fingertip Effacement (%): Thick Cervical Position: Posterior Station: Ballotable Presentation: Vertex Exam by:: [email or web address] CNM   Fetal Monitoring: reactive Baseline: 125 Variability: moderate Accelerations: present Decelerations: occasional variable  Contractions: q3-56min   A: SIUP at [redacted]w[redacted]d  False labor  P: Discussed water immersion at home for ease of prodromal labor Encouraged pt to eat, hydrate and rest Discharge to home with labor precautions  [redacted]w[redacted]d, CNM 10/05/2020 1:17 AM

## 2020-10-10 ENCOUNTER — Other Ambulatory Visit (HOSPITAL_COMMUNITY)
Admission: RE | Admit: 2020-10-10 | Discharge: 2020-10-10 | Disposition: A | Discharge status: Home / Self Care | Payer: BC Managed Care – PPO | Source: Ambulatory Visit | Attending: Obstetrics & Gynecology | Admitting: Obstetrics & Gynecology

## 2020-10-10 DIAGNOSIS — Z20822 Contact with and (suspected) exposure to covid-19: Secondary | ICD-10-CM | POA: Insufficient documentation

## 2020-10-10 DIAGNOSIS — Z01812 Encounter for preprocedural laboratory examination: Secondary | ICD-10-CM | POA: Insufficient documentation

## 2020-10-10 LAB — SARS CORONAVIRUS 2 (TAT 6-24 HRS): SARS Coronavirus 2: NEGATIVE

## 2020-10-11 ENCOUNTER — Other Ambulatory Visit (HOSPITAL_COMMUNITY): Payer: BC Managed Care – PPO

## 2020-10-13 ENCOUNTER — Inpatient Hospital Stay (HOSPITAL_COMMUNITY)
Admission: AD | Admit: 2020-10-13 | Discharge: 2020-10-15 | DRG: 807 | Disposition: A | Discharge status: Home / Self Care | Payer: BC Managed Care – PPO | Attending: Obstetrics & Gynecology | Admitting: Obstetrics & Gynecology

## 2020-10-13 ENCOUNTER — Inpatient Hospital Stay (HOSPITAL_COMMUNITY): Payer: BC Managed Care – PPO | Admitting: Anesthesiology

## 2020-10-13 ENCOUNTER — Other Ambulatory Visit: Payer: Self-pay

## 2020-10-13 ENCOUNTER — Inpatient Hospital Stay (HOSPITAL_COMMUNITY): Payer: BC Managed Care – PPO

## 2020-10-13 ENCOUNTER — Encounter (HOSPITAL_COMMUNITY): Payer: Self-pay | Admitting: Obstetrics and Gynecology

## 2020-10-13 DIAGNOSIS — O9912 Other diseases of the blood and blood-forming organs and certain disorders involving the immune mechanism complicating childbirth: Secondary | ICD-10-CM | POA: Diagnosis present

## 2020-10-13 DIAGNOSIS — Z3A39 39 weeks gestation of pregnancy: Secondary | ICD-10-CM

## 2020-10-13 DIAGNOSIS — O09523 Supervision of elderly multigravida, third trimester: Secondary | ICD-10-CM | POA: Diagnosis present

## 2020-10-13 DIAGNOSIS — O99214 Obesity complicating childbirth: Secondary | ICD-10-CM | POA: Diagnosis present

## 2020-10-13 DIAGNOSIS — D696 Thrombocytopenia, unspecified: Secondary | ICD-10-CM | POA: Diagnosis present

## 2020-10-13 DIAGNOSIS — O26893 Other specified pregnancy related conditions, third trimester: Secondary | ICD-10-CM | POA: Diagnosis present

## 2020-10-13 DIAGNOSIS — O43123 Velamentous insertion of umbilical cord, third trimester: Secondary | ICD-10-CM | POA: Diagnosis present

## 2020-10-13 DIAGNOSIS — Z20822 Contact with and (suspected) exposure to covid-19: Secondary | ICD-10-CM | POA: Diagnosis present

## 2020-10-13 LAB — TYPE AND SCREEN
ABO/RH(D): A POS
Antibody Screen: NEGATIVE

## 2020-10-13 LAB — CBC
HCT: 34 % — ABNORMAL LOW (ref 36.0–46.0)
Hemoglobin: 11 g/dL — ABNORMAL LOW (ref 12.0–15.0)
MCH: 31.8 pg (ref 26.0–34.0)
MCHC: 32.4 g/dL (ref 30.0–36.0)
MCV: 98.3 fL (ref 80.0–100.0)
Platelets: 146 10*3/uL — ABNORMAL LOW (ref 150–400)
RBC: 3.46 MIL/uL — ABNORMAL LOW (ref 3.87–5.11)
RDW: 13.9 % (ref 11.5–15.5)
WBC: 5 10*3/uL (ref 4.0–10.5)
nRBC: 0 % (ref 0.0–0.2)

## 2020-10-13 LAB — RPR: RPR Ser Ql: NONREACTIVE

## 2020-10-13 MED ORDER — MISOPROSTOL 200 MCG PO TABS
ORAL_TABLET | ORAL | Status: AC
  Administered 2020-10-13: 1000 ug via RECTAL
  Filled 2020-10-13: qty 5

## 2020-10-13 MED ORDER — METHYLERGONOVINE MALEATE 0.2 MG/ML IJ SOLN
INTRAMUSCULAR | Status: AC
  Filled 2020-10-13: qty 1

## 2020-10-13 MED ORDER — OXYCODONE HCL 5 MG PO TABS
5.0000 mg | ORAL_TABLET | ORAL | Status: DC | PRN
  Administered 2020-10-15: 5 mg via ORAL
  Filled 2020-10-13: qty 1

## 2020-10-13 MED ORDER — OXYCODONE-ACETAMINOPHEN 5-325 MG PO TABS: 2.0000 | ORAL_TABLET | ORAL | Status: DC | PRN

## 2020-10-13 MED ORDER — EPHEDRINE 5 MG/ML INJ: 10.0000 mg | INTRAVENOUS | Status: DC | PRN

## 2020-10-13 MED ORDER — LACTATED RINGERS IV SOLN: INTRAVENOUS | Status: DC

## 2020-10-13 MED ORDER — BENZOCAINE-MENTHOL 20-0.5 % EX AERO
1.0000 "application " | INHALATION_SPRAY | CUTANEOUS | Status: DC | PRN
  Administered 2020-10-15: 1 via TOPICAL
  Filled 2020-10-13: qty 56

## 2020-10-13 MED ORDER — WITCH HAZEL-GLYCERIN EX PADS: 1.0000 "application " | MEDICATED_PAD | CUTANEOUS | Status: DC | PRN

## 2020-10-13 MED ORDER — OXYTOCIN BOLUS FROM INFUSION
333.0000 mL | Freq: Once | INTRAVENOUS | Status: AC
  Administered 2020-10-13: 333 mL via INTRAVENOUS

## 2020-10-13 MED ORDER — MISOPROSTOL 200 MCG PO TABS: 1000.0000 ug | ORAL_TABLET | Freq: Once | ORAL | Status: AC

## 2020-10-13 MED ORDER — METHYLERGONOVINE MALEATE 0.2 MG/ML IJ SOLN
0.2000 mg | Freq: Once | INTRAMUSCULAR | Status: AC
  Administered 2020-10-13: 0.2 mg via INTRAMUSCULAR

## 2020-10-13 MED ORDER — SOD CITRATE-CITRIC ACID 500-334 MG/5ML PO SOLN: 30.0000 mL | ORAL | Status: DC | PRN

## 2020-10-13 MED ORDER — SODIUM CHLORIDE 0.9% FLUSH: 3.0000 mL | INTRAVENOUS | Status: DC | PRN

## 2020-10-13 MED ORDER — OXYTOCIN-SODIUM CHLORIDE 30-0.9 UT/500ML-% IV SOLN: 2.5000 [IU]/h | INTRAVENOUS | Status: DC

## 2020-10-13 MED ORDER — SODIUM CHLORIDE 0.9 % IV SOLN: 250.0000 mL | INTRAVENOUS | Status: DC | PRN

## 2020-10-13 MED ORDER — TERBUTALINE SULFATE 1 MG/ML IJ SOLN: 0.2500 mg | Freq: Once | INTRAMUSCULAR | Status: DC | PRN

## 2020-10-13 MED ORDER — LIDOCAINE HCL (PF) 1 % IJ SOLN: 30.0000 mL | INTRAMUSCULAR | Status: DC | PRN

## 2020-10-13 MED ORDER — FLEET ENEMA 7-19 GM/118ML RE ENEM: 1.0000 | ENEMA | Freq: Every day | RECTAL | Status: DC | PRN

## 2020-10-13 MED ORDER — OXYCODONE HCL 5 MG PO TABS
10.0000 mg | ORAL_TABLET | ORAL | Status: DC | PRN
  Administered 2020-10-14: 10 mg via ORAL
  Filled 2020-10-13: qty 2

## 2020-10-13 MED ORDER — DIPHENHYDRAMINE HCL 50 MG/ML IJ SOLN: 12.5000 mg | INTRAMUSCULAR | Status: DC | PRN

## 2020-10-13 MED ORDER — ONDANSETRON HCL 4 MG/2ML IJ SOLN
4.0000 mg | Freq: Four times a day (QID) | INTRAMUSCULAR | Status: DC | PRN
  Administered 2020-10-13: 4 mg via INTRAVENOUS
  Filled 2020-10-13: qty 2

## 2020-10-13 MED ORDER — PRENATAL MULTIVITAMIN CH
1.0000 | ORAL_TABLET | Freq: Every day | ORAL | Status: DC
  Administered 2020-10-14 – 2020-10-15 (×2): 1 via ORAL
  Filled 2020-10-13 (×2): qty 1

## 2020-10-13 MED ORDER — SENNOSIDES-DOCUSATE SODIUM 8.6-50 MG PO TABS
2.0000 | ORAL_TABLET | ORAL | Status: DC
  Administered 2020-10-13 – 2020-10-14 (×2): 2 via ORAL
  Filled 2020-10-13 (×2): qty 2

## 2020-10-13 MED ORDER — ACETAMINOPHEN 325 MG PO TABS: 650.0000 mg | ORAL_TABLET | ORAL | Status: DC | PRN

## 2020-10-13 MED ORDER — SODIUM CHLORIDE 0.9% FLUSH: 3.0000 mL | Freq: Two times a day (BID) | INTRAVENOUS | Status: DC

## 2020-10-13 MED ORDER — DIBUCAINE (PERIANAL) 1 % EX OINT: 1.0000 "application " | TOPICAL_OINTMENT | CUTANEOUS | Status: DC | PRN

## 2020-10-13 MED ORDER — ACETAMINOPHEN 325 MG PO TABS
650.0000 mg | ORAL_TABLET | ORAL | Status: DC | PRN
  Administered 2020-10-14 (×3): 650 mg via ORAL
  Filled 2020-10-13 (×3): qty 2

## 2020-10-13 MED ORDER — FLEET ENEMA 7-19 GM/118ML RE ENEM: 1.0000 | ENEMA | RECTAL | Status: DC | PRN

## 2020-10-13 MED ORDER — DIPHENHYDRAMINE HCL 25 MG PO CAPS: 25.0000 mg | ORAL_CAPSULE | Freq: Four times a day (QID) | ORAL | Status: DC | PRN

## 2020-10-13 MED ORDER — FENTANYL CITRATE (PF) 100 MCG/2ML IJ SOLN
50.0000 ug | Freq: Once | INTRAMUSCULAR | Status: AC
  Administered 2020-10-13: 50 ug via INTRAVENOUS
  Filled 2020-10-13: qty 2

## 2020-10-13 MED ORDER — COCONUT OIL OIL
1.0000 "application " | TOPICAL_OIL | Status: DC | PRN
  Administered 2020-10-15: 1 via TOPICAL

## 2020-10-13 MED ORDER — SIMETHICONE 80 MG PO CHEW: 80.0000 mg | CHEWABLE_TABLET | ORAL | Status: DC | PRN

## 2020-10-13 MED ORDER — PHENYLEPHRINE 40 MCG/ML (10ML) SYRINGE FOR IV PUSH (FOR BLOOD PRESSURE SUPPORT): 80.0000 ug | PREFILLED_SYRINGE | INTRAVENOUS | Status: DC | PRN

## 2020-10-13 MED ORDER — ONDANSETRON HCL 4 MG/2ML IJ SOLN: 4.0000 mg | INTRAMUSCULAR | Status: DC | PRN

## 2020-10-13 MED ORDER — LACTATED RINGERS IV SOLN
500.0000 mL | INTRAVENOUS | Status: DC | PRN
  Administered 2020-10-13 (×3): 500 mL via INTRAVENOUS

## 2020-10-13 MED ORDER — IBUPROFEN 600 MG PO TABS
600.0000 mg | ORAL_TABLET | Freq: Four times a day (QID) | ORAL | Status: DC
  Administered 2020-10-13 – 2020-10-15 (×7): 600 mg via ORAL
  Filled 2020-10-13 (×7): qty 1

## 2020-10-13 MED ORDER — TETANUS-DIPHTH-ACELL PERTUSSIS 5-2.5-18.5 LF-MCG/0.5 IM SUSY: 0.5000 mL | PREFILLED_SYRINGE | Freq: Once | INTRAMUSCULAR | Status: DC

## 2020-10-13 MED ORDER — LIDOCAINE HCL (PF) 1 % IJ SOLN
INTRAMUSCULAR | Status: DC | PRN
  Administered 2020-10-13: 5 mL via EPIDURAL

## 2020-10-13 MED ORDER — ONDANSETRON HCL 4 MG PO TABS: 4.0000 mg | ORAL_TABLET | ORAL | Status: DC | PRN

## 2020-10-13 MED ORDER — FENTANYL-BUPIVACAINE-NACL 0.5-0.125-0.9 MG/250ML-% EP SOLN
12.0000 mL/h | EPIDURAL | Status: DC | PRN
  Filled 2020-10-13: qty 250

## 2020-10-13 MED ORDER — MISOPROSTOL 25 MCG QUARTER TABLET
25.0000 ug | ORAL_TABLET | ORAL | Status: DC | PRN
  Administered 2020-10-13: 25 ug via VAGINAL
  Filled 2020-10-13: qty 1

## 2020-10-13 MED ORDER — OXYTOCIN-SODIUM CHLORIDE 30-0.9 UT/500ML-% IV SOLN
1.0000 m[IU]/min | INTRAVENOUS | Status: DC
  Administered 2020-10-13: 2 m[IU]/min via INTRAVENOUS
  Filled 2020-10-13: qty 500

## 2020-10-13 MED ORDER — LACTATED RINGERS IV SOLN
500.0000 mL | Freq: Once | INTRAVENOUS | Status: AC
  Administered 2020-10-13: 500 mL via INTRAVENOUS

## 2020-10-13 MED ORDER — BISACODYL 10 MG RE SUPP: 10.0000 mg | Freq: Every day | RECTAL | Status: DC | PRN

## 2020-10-13 MED ORDER — OXYCODONE-ACETAMINOPHEN 5-325 MG PO TABS: 1.0000 | ORAL_TABLET | ORAL | Status: DC | PRN

## 2020-10-13 MED ORDER — SODIUM CHLORIDE (PF) 0.9 % IJ SOLN
INTRAMUSCULAR | Status: DC | PRN
  Administered 2020-10-13: 12 mL/h via EPIDURAL

## 2020-10-13 NOTE — Progress Notes (Signed)
OBGYN Note Roberta Galvan 34 y.o. G4P3003 at [redacted]w[redacted]d IOL 2/2 AMA Previously having variable decels, repositioned, fluid bolus, stopped pit, now resolved SVE 9/90/-1 FHR 120, moderate variability, +accels, Cat 1 Toco q4-1m -Restart pitocin Roberta Galvan 10/13/20 5:58 PM

## 2020-10-13 NOTE — Anesthesia Procedure Notes (Signed)

## 2020-10-13 NOTE — Plan of Care (Signed)

## 2020-10-13 NOTE — Anesthesia Preprocedure Evaluation (Signed)
Anesthesia Evaluation  Patient identified by MRN, date of birth, ID band Patient awake    Reviewed: Allergy & Precautions, NPO status , Patient's Chart, lab work & pertinent test results  History of Anesthesia Complications History of anesthetic complications: Hx of leg numbness.  Airway Mallampati: III  TM Distance: >3 FB Neck ROM: Full    Dental no notable dental hx. (+) Teeth Intact, Dental Advisory Given   Pulmonary neg pulmonary ROS,    Pulmonary exam normal breath sounds clear to auscultation       Cardiovascular Exercise Tolerance: Good negative cardio ROS Normal cardiovascular exam Rhythm:Regular Rate:Normal     Neuro/Psych  Headaches,    GI/Hepatic negative GI ROS, Neg liver ROS,   Endo/Other  negative endocrine ROS  Renal/GU negative Renal ROS     Musculoskeletal   Abdominal (+) + obese,   Peds  Hematology  (+) Blood dyscrasia, , Hgb 11.0  Plt 146   Anesthesia Other Findings   Reproductive/Obstetrics (+) Pregnancy                             Anesthesia Physical Anesthesia Plan  ASA: III  Anesthesia Plan: Epidural   Post-op Pain Management:    Induction:   PONV Risk Score and Plan:   Airway Management Planned:   Additional Equipment:   Intra-op Plan:   Post-operative Plan:   Informed Consent: I have reviewed the patients History and Physical, chart, labs and discussed the procedure including the risks, benefits and alternatives for the proposed anesthesia with the patient or authorized representative who has indicated his/her understanding and acceptance.       Plan Discussed with: CRNA and Anesthesiologist  Anesthesia Plan Comments: (39.4 Wk G4P3 hx of Gestational thrombocytopenia)        Anesthesia Quick Evaluation

## 2020-10-13 NOTE — H&P (Signed)
Roberta Galvan is a 34 y.o. female [phone] [redacted]w[redacted]d presenting for IOL 2/2 AMA. She reports no LOF, VB, Contractions on admission. Has had Normal FM.   Pregnancy c/b: 1. AMA: declined genetic screen. Normal anatomy 2. Obesity: Prepregnancy 39.5, current BMI 47. Most recent EFW 52.1% on 09/09/2020 3. Thrombocytopenia: Initial OB labs plts 158, at 28w plt 142. 4. Resolved placenta previa/vasa previa: initially on anatomy scan placenta previa, then concern for vasa previa, MFM consulted, resolved on follow up scans  OB History    Gravida  4   Para  3   Term  3   Preterm      AB      Living  3     SAB      TAB      Ectopic      Multiple      Live Births  3          Past Medical History:  Diagnosis Date  . Complication of anesthesia    NUMBNESS IN  LEGS  . Headache behind the eyes   . History of anemia   . No pertinent past medical history   . Vitamin D deficiency    Past Surgical History:  Procedure Laterality Date  . NO PAST SURGERIES     Family History: family history is not on file. Social History:  reports that she has never smoked. She has never used smokeless tobacco. She reports that she does not drink alcohol and does not use drugs.     Maternal Diabetes: No Genetic Screening: Declined Maternal Ultrasounds/Referrals: Normal. Posterior placenta Fetal Ultrasounds or other Referrals:  Referred to Materal Fetal Medicine  - eval for placenta previa/vasa previa Maternal Substance Abuse:  No Significant Maternal Medications:  None Significant Maternal Lab Results:  Group B Strep negative Other Comments:  None  Review of Systems Per HPI Exam Physical Exam  Dilation: 4.5 Effacement (%): 50 Station: -3 Exam by:: Alexis [place], RN Blood pressure (!) 106/56, pulse 74, temperature 98.2 F (36.8 C), temperature source Oral, resp. rate 16, height 5\' 6"  (1.676 m), weight 134.2 kg. NAD, resting comfortably, painful with contractions Gravid abdomen Fetal  testing: FHR 125, Cat 1. toco q12m Prenatal labs: ABO, Rh:  --/--/A POS (11/22 0045) Antibody: NEG (11/22 0045) Rubella: Immune (04/15 0000) RPR: Nonreactive (09/03 0000)  HBsAg: Negative (04/15 0000)  HIV: Non-reactive (04/15 0000)  GBS: Negative/-- (11/02 0000)   Recent Labs    10/13/20 0054  WBC 5.0  HGB 11.0*  HCT 34.0*  PLT 146*   Assessment/Plan: Roberta Galvan 34 y.o. 10/15/20 at [redacted]w[redacted]d here for IOL 2/2 AMA 1. IOL: On admission closed, received 1 cytotec, then started pitocin. Now SVE 5/50/-2 and s/p AROM with clear fluid 2. AMA: declined genetic screen. Normal anatomy 3. Obesity: Prepregnancy 39.5, current BMI 47 4. Thrombocytopenia: Initial OB labs plts 158, at 28w plt 142. On admission her plt are 146. Suspect gestational thrombocytopenia 5. Vaccines: s/p tdap and flu in pregnancy. Has not received COVID vaccine, planning postpartum  Roberta Galvan 10/13/2020, 8:08 AM

## 2020-10-14 LAB — CBC
HCT: 31.4 % — ABNORMAL LOW (ref 36.0–46.0)
Hemoglobin: 10.5 g/dL — ABNORMAL LOW (ref 12.0–15.0)
MCH: 32.2 pg (ref 26.0–34.0)
MCHC: 33.4 g/dL (ref 30.0–36.0)
MCV: 96.3 fL (ref 80.0–100.0)
Platelets: 136 10*3/uL — ABNORMAL LOW (ref 150–400)
RBC: 3.26 MIL/uL — ABNORMAL LOW (ref 3.87–5.11)
RDW: 13.8 % (ref 11.5–15.5)
WBC: 7.2 10*3/uL (ref 4.0–10.5)
nRBC: 0 % (ref 0.0–0.2)

## 2020-10-14 NOTE — Lactation Note (Signed)
This note was copied from a baby's chart. Lactation Consultation Note  Patient Name: Roberta Galvan [id number]'Y Date: 10/14/2020 Reason for consult: Initial assessment;Term;Infant weight loss;Other (Comment) (1 % -  mom eating dinner and per mom has been bottle feeding because my nipple is to large for the baby'e mouth - see LC note)  Baby is 22 hours old and has not been to the latch and is receiving bottles due to moms nipples being to large for the baby's mouth.  Per mom active with WIC and does not have a pump at home.  LC discussed supply and demand / and the importance of the 1st 2 weeks of stimulating the breast to enhance the let down to establish the milk supply.  LC offered to set up the DEBP and mentioned to mom she would have to pump at least 8 - 10 times a day around the clock and the [facility] could send a referral to [facility] for a DEBP. When LC explained to mom she would only be able to obtain a pump or formula at [facility] . Per mom I prefer the formula and did not accept the offer to set up a DEBP at this time.  LC encouraged mom to call for [facility] is she changed her mind.  LC left the [facility] brochure with resource numbers.    Maternal Data    Feeding Feeding Type:  (family member feeding baby a bottle at present)  LATCH Score                   Interventions    Lactation Tools Discussed/Used WIC Program: Yes (per mom)   Consult Status Consult Status: PRN Date: 10/15/20 Follow-up type: In-patient    Roberta Galvan 10/14/2020, 5:59 PM

## 2020-10-14 NOTE — Anesthesia Postprocedure Evaluation (Signed)
Anesthesia Post Note  Patient: Roberta Galvan  Procedure(s) Performed: AN AD HOC LABOR EPIDURAL     Patient location during evaluation: Mother Baby Anesthesia Type: Epidural Level of consciousness: awake and alert Pain management: pain level controlled Vital Signs Assessment: post-procedure vital signs reviewed and stable Respiratory status: spontaneous breathing, nonlabored ventilation and respiratory function stable Cardiovascular status: stable Postop Assessment: no headache, no backache, epidural receding, no apparent nausea or vomiting, patient able to bend at knees, adequate PO intake and able to ambulate Anesthetic complications: no   No complications documented.  Last Vitals:  Vitals:   10/13/20 2333 10/14/20 0510  BP: 116/60 (!) 104/57  Pulse: 83 64  Resp: 16 16  Temp: 36.9 C 37.1 C  SpO2: 100% 100%    Last Pain:  Vitals:   10/14/20 0510  TempSrc: Oral  PainSc: 3    Pain Goal: Patients Stated Pain Goal: 3 (10/14/20 0510)                 Laban Emperor

## 2020-10-14 NOTE — Progress Notes (Signed)
Post Partum Day 1 Subjective: no complaints, up ad lib, voiding and tolerating PO  Objective: Blood pressure (!) 121/59, pulse 70, temperature 97.9 F (36.6 C), resp. rate 17, height 5\' 6"  (1.676 m), weight 134.2 kg, SpO2 100 %, unknown if currently breastfeeding.  Physical Exam:  General: alert, cooperative and appears stated age Lochia: appropriate Uterine Fundus: firm DVT Evaluation: No evidence of DVT seen on physical exam.  Recent Labs    10/13/20 0054 10/14/20 0613  HGB 11.0* 10.5*  HCT 34.0* 31.4*    Assessment/Plan: Plan for discharge tomorrow and Breastfeeding  Desires neonatal circumcision, R/B/A of procedure discussed at length. Pt understands that neonatal circumcision is not considered medically necessary and is elective. The risks include, but are not limited to bleeding, infection, damage to the penis, development of scar tissue, and having to have it redone at a later date. Pt understands theses risks and wishes to proceed    LOS: 1 day   10/16/20 10/14/2020, 9:55 AM

## 2020-10-14 NOTE — Lactation Note (Signed)
This note was copied from a baby's chart. Lactation Consultation Note Baby 28 hrs old. RN called stated mom needed assistance. Mom stated her nipple was to large for the baby, she didn't think she can latch. Wanted to try. LC positioned baby in football hold. Nipple to large for baby at this time. Mom stated she had this problem with her other children so she pumped and bottle fed. Would BF some when they got bigger.  Mom agreed to use DEBP.  Mom sold her DEBP so she will purchase another one. Asked mom if she would be able to get one as soon as she goes home mom stated yes.  LC reviewed milk storage of colostrum and formula feeding and formula time limit. Gave mom how much to formula to give at hours of age.  Mom shown how to use DEBP & how to disassemble, clean, & reassemble parts. Mom knows to pump q3h for 15-20 min. Moms nipples are large. Lt. Nipple larger than Rt. Gave mom #30 flanges. Mom stated comfortable. Coconut oil given and applied before mom pumped. Mom pumping when LC left. Mom encouraged to feed baby 8-12 times/24 hours and with feeding cues.   Encouraged to call for questions or concerns.  Patient Name: Boy Conita Amenta IRSWN'I Date: 10/14/2020 Reason for consult: Follow-up assessment;Term   Maternal Data    Feeding Feeding Type: Formula Nipple Type: Slow - flow  LATCH Score       Type of Nipple: Everted at rest and after stimulation  Comfort (Breast/Nipple): Filling, red/small blisters or bruises, mild/mod discomfort (mom states nipples are sore)        Interventions Interventions: DEBP;Breast massage;Breast compression;Coconut oil  Lactation Tools Discussed/Used Tools: Flanges Flange Size: 30 Pump Review: Setup, frequency, and cleaning;Milk Storage Initiated by:: Peri Jefferson RN IBCLC Date initiated:: 10/14/20   Consult Status Consult Status: Follow-up Date: 10/15/20 Follow-up type: In-patient    Charyl Dancer 10/14/2020, 11:55  PM

## 2020-10-15 LAB — SURGICAL PATHOLOGY

## 2020-10-15 MED ORDER — IBUPROFEN 600 MG PO TABS: 600.0000 mg | ORAL_TABLET | Freq: Four times a day (QID) | ORAL | 1 refills | Status: AC | PRN

## 2020-10-15 NOTE — Progress Notes (Signed)
Post Partum Day  2 Subjective: Roberta Galvan is doing well today with no complaints. She is ambulating, voiding, tolerating PO. Minimal lochia. Breastfeeding. She is eager for discharge.   Objective: Patient Vitals for the past 24 hrs:  BP Temp Temp src Pulse Resp SpO2  10/14/20 2013 (!) 112/52 98.1 F (36.7 C) Oral 70 16 100 %  10/14/20 1943 (!) 118/53 98.2 F (36.8 C) Oral 71 15 100 %  10/14/20 1505 (!) 104/53 98 F (36.7 C) -- 69 18 100 %    Physical Exam:  General: alert, cooperative and no distress Lochia: appropriate Uterine Fundus: firm DVT Evaluation: No evidence of DVT seen on physical exam.  Recent Labs    10/13/20 0054 10/14/20 0613  WBC 5.0 7.2  HGB 11.0* 10.5*  HCT 34.0* 31.4*  PLT 146* 136*    No results for input(s): NA, K, CL, CO2CT, BUN, CREATININE, GLUCOSE, BILITOT, ALT, AST, ALKPHOS, PROT, ALBUMIN in the last 72 hours.  No results for input(s): CALCIUM, MG, PHOS in the last 72 hours.  No results for input(s): PROTIME, APTT, INR in the last 72 hours.  No results for input(s): PROTIME, APTT, INR, FIBRINOGEN in the last 72 hours. Assessment/Plan: Discharge home  Roberta Galvan 34 y.o. [id number] PPD#2 sp SVD 1. PPC: continue routine postpartum care 2. Desires circumcision: Desires neonatal circumcision, R/B/A of procedure discussed at length. Pt understands that neonatal circumcision is not considered medically necessary and is elective. The risks include, but are not limited to bleeding, infection, damage to the penis, development of scar tissue, and having to have it redone at a later date. Pt understands theses risks and wishes to proceed.  3. Rh pos, rubella immune, tdap given prenatally 4. Dispo: stable for discharge home. Discharge instructions reviewed. Follow up in 4 weeks.   LOS: 2 days   Charlett Nose 10/15/2020, 12:20 PM

## 2020-10-15 NOTE — Discharge Summary (Signed)
Postpartum Discharge Summary      Patient Name: Roberta Galvan DOB: 16-Jul-1986 MRN: [id number]  Date of admission: 10/13/2020 Delivery date:10/13/2020  Delivering provider: Lyda Kalata K  Date of discharge: 10/15/2020  Admitting diagnosis: AMA (advanced maternal age) multigravida 55+, third trimester [O09.523] Intrauterine pregnancy: [redacted]w[redacted]d    Secondary diagnosis:  Active Problems:   AMA (advanced maternal age) multigravida 384+ third trimester  Additional problems: none   Discharge diagnosis: Term Pregnancy Delivered                                              Post partum procedures:none Augmentation: AROM, Pitocin and Cytotec Complications: None  Hospital course: Induction of Labor With Vaginal Delivery   34y.o. yo [name] admitted to the hospital 10/13/2020 for induction of labor.  Indication for induction: AMA.  Patient had an uncomplicated labor course as follows: Membrane Rupture Time/Date: 9:13 AM ,10/13/2020   Delivery Method:Vaginal, Spontaneous  Episiotomy: None  Lacerations:  None  Details of delivery can be found in separate delivery note.  Patient had a routine postpartum course. Patient is discharged home 10/15/20.  Newborn Data: Birth date:10/13/2020  Birth time:7:47 PM  Gender:Female  Living status:Living  Apgars:7 ,9  Weight:3685 g   Magnesium Sulfate received: No BMZ received: No Rhophylac:N/A MMR:N/A T-DaP:Given prenatally Flu: No Transfusion:No  Physical exam  Vitals:   10/14/20 0904 10/14/20 1505 10/14/20 1943 10/14/20 2013  BP: (!) 121/59 (!) 104/53 (!) 118/53 (!) 112/52  Pulse: 70 69 71 70  Resp: _0 Temp: 97.9 F (36.6 C) 98 F (36.7 C) 98.2 F (36.8 C) 98.1 F (36.7 C)  TempSrc:   Oral Oral  SpO2: 100% 100% 100% 100%  Weight:      Height:       General: alert, cooperative and no distress Lochia: appropriate Uterine Fundus: firm Incision: N/A DVT Evaluation: No evidence of DVT seen on  physical exam. Labs: Lab Results  Component Value Date   WBC 7.2 10/14/2020   HGB 10.5 (L) 10/14/2020   HCT 31.4 (L) 10/14/2020   MCV 96.3 10/14/2020   PLT 136 (L) 10/14/2020   CMP Latest Ref Rng & Units 07/17/2020  Glucose 70 - 99 mg/dL 96  BUN 6 - 20 mg/dL 10  Creatinine 0.44 - 1.00 mg/dL 0.70  Sodium 135 - 145 mmol/L 134(L)  Potassium 3.5 - 5.1 mmol/L 3.4(L)  Chloride 98 - 111 mmol/L 105  CO2 22 - 32 mmol/L 21(L)  Calcium 8.9 - 10.3 mg/dL 8.9  Total Protein 6.5 - 8.1 g/dL 5.9(L)  Total Bilirubin 0.3 - 1.2 mg/dL <0.1(L)  Alkaline Phos 38 - 126 U/L 49  AST 15 - 41 U/L 21  ALT 0 - 44 U/L 16   Edinburgh Score: Edinburgh Postnatal Depression Scale Screening Tool 10/15/2020  I have been able to laugh and see the funny side of things. 0  I have looked forward with enjoyment to things. 0  I have blamed myself unnecessarily when things went wrong. 0  I have been anxious or worried for no good reason. 0  I have felt scared or panicky for no good reason. 0  Things have been getting on top of me. 0  I have been so unhappy that I have had difficulty sleeping. 0  I have felt sad or miserable. 0  I have been so unhappy that I have been crying. 0  The thought of harming myself has occurred to me. 0  Edinburgh Postnatal Depression Scale Total 0      After visit meds:  Allergies as of 10/15/2020   No Known Allergies     Medication List    STOP taking these medications   aspirin 81 MG chewable tablet   cholecalciferol 1000 units tablet Commonly known as: VITAMIN D   ferrous sulfate 325 (65 FE) MG tablet   multivitamin-prenatal 27-0.8 MG Tabs tablet        Discharge home in stable condition Infant Feeding: Breast Infant Disposition:home with mother Discharge instruction: per After Visit Summary and Postpartum booklet. Activity: Advance as tolerated. Pelvic rest for 6 weeks.  Diet: routine diet Anticipated Birth Control: Unsure Postpartum Appointment:4  weeks Additional Postpartum F/U: n/a Future Appointments:No future appointments.      10/15/2020 Rowland Lathe, MD

## 2020-10-15 NOTE — Lactation Note (Signed)
This note was copied from a baby's chart. Lactation Consultation Note  Patient Name: Roberta Galvan [id number] Date: 10/15/2020 Reason for consult: Follow-up assessment;Infant weight loss;Other (Comment) (mom is exclusively pumping due to the size of her nipples/ to large for baby) Baby is 41 hours old , 1 % weight loss.  Per mom has only pumped x 1 when lactation set the DEBP up.  Mom mentioned after pumping her nipples felt tender and has been given coconut oil.  LC provided 2 - #36 flanges.  Per mom plans to buy a DEBP today.  Sore nipple and engorgement prevention and tx reviewed, storage of breast milk, and the importance of being consistent with pumping at least 10 times a day.  Mom has the [facility] brochure with resource numbers.   Maternal Data    Feeding Feeding Type: Formula Nipple Type: Slow - flow  LATCH Score                   Interventions Interventions: Breast feeding basics reviewed  Lactation Tools Discussed/Used Pump Review: Milk Storage   Consult Status Consult Status: Complete Date: 10/15/20    Matilde Sprang Kitara Hebb 10/15/2020, 1:08 PM

## 2021-04-22 ENCOUNTER — Other Ambulatory Visit: Payer: Self-pay | Admitting: Internal Medicine

## 2021-04-23 LAB — COMPLETE METABOLIC PANEL WITH GFR
AG Ratio: 1.5 (calc) (ref 1.0–2.5)
ALT: 21 U/L (ref 6–29)
AST: 20 U/L (ref 10–30)
Albumin: 4.2 g/dL (ref 3.6–5.1)
Alkaline phosphatase (APISO): 67 U/L (ref 31–125)
BUN: 15 mg/dL (ref 7–25)
CO2: 23 mmol/L (ref 20–32)
Calcium: 9.3 mg/dL (ref 8.6–10.2)
Chloride: 106 mmol/L (ref 98–110)
Creat: 0.78 mg/dL (ref 0.50–1.10)
GFR, Est African American: 113 mL/min/{1.73_m2} (ref >=60)
GFR, Est Non African American: 98 mL/min/{1.73_m2} (ref >=60)
Globulin: 2.8 g/dL (calc) (ref 1.9–3.7)
Glucose, Bld: 69 mg/dL (ref 65–99)
Potassium: 3.9 mmol/L (ref 3.5–5.3)
Sodium: 140 mmol/L (ref 135–146)
Total Bilirubin: 0.8 mg/dL (ref 0.2–1.2)
Total Protein: 7 g/dL (ref 6.1–8.1)

## 2021-04-23 LAB — LIPID PANEL
Cholesterol: 154 mg/dL (ref <200)
HDL: 45 mg/dL — ABNORMAL LOW (ref >=50)
LDL Cholesterol (Calc): 98 mg/dL (calc)
Non-HDL Cholesterol (Calc): 109 mg/dL (calc) (ref <130)
Total CHOL/HDL Ratio: 3.4 (calc) (ref <5.0)
Triglycerides: 39 mg/dL (ref <150)

## 2021-04-23 LAB — VITAMIN D 25 HYDROXY (VIT D DEFICIENCY, FRACTURES): Vit D, 25-Hydroxy: 104 ng/mL — ABNORMAL HIGH (ref 30–100)

## 2021-04-23 LAB — TSH: TSH: 1.23 mIU/L

## 2021-04-23 LAB — CBC
HCT: 38.9 % (ref 35.0–45.0)
Hemoglobin: 12.6 g/dL (ref 11.7–15.5)
MCH: 30.9 pg (ref 27.0–33.0)
MCHC: 32.4 g/dL (ref 32.0–36.0)
MCV: 95.3 fL (ref 80.0–100.0)
MPV: 12.3 fL (ref 7.5–12.5)
Platelets: 152 10*3/uL (ref 140–400)
RBC: 4.08 10*6/uL (ref 3.80–5.10)
RDW: 12.3 % (ref 11.0–15.0)
WBC: 4.1 10*3/uL (ref 3.8–10.8)

## 2021-04-23 LAB — IRON, TOTAL/TOTAL IRON BINDING CAP
%SAT: 26 % (calc) (ref 16–45)
Iron: 79 ug/dL (ref 40–190)
TIBC: 299 mcg/dL (calc) (ref 250–450)

## 2021-04-23 LAB — RETICULOCYTES
ABS Retic: 57120 cells/uL (ref 20000–80000)
Retic Ct Pct: 1.4 %

## 2021-04-23 LAB — B12 AND FOLATE PANEL
Folate: >24 ng/mL
Vitamin B-12: 865 pg/mL (ref 200–1100)

## 2021-04-23 LAB — SICKLE CELL SCREEN: Sickle Solubility Test - HGBRFX: NEGATIVE

## 2021-04-23 LAB — FERRITIN: Ferritin: 19 ng/mL (ref 16–154)

## 2022-07-13 ENCOUNTER — Other Ambulatory Visit: Payer: Self-pay | Admitting: Internal Medicine

## 2022-07-14 LAB — COMPLETE METABOLIC PANEL WITH GFR
AG Ratio: 1.5 (calc) (ref 1.0–2.5)
ALT: 14 U/L (ref 6–29)
AST: 22 U/L (ref 10–30)
Albumin: 4.1 g/dL (ref 3.6–5.1)
Alkaline phosphatase (APISO): 55 U/L (ref 31–125)
BUN: 13 mg/dL (ref 7–25)
CO2: 24 mmol/L (ref 20–32)
Calcium: 9.3 mg/dL (ref 8.6–10.2)
Chloride: 108 mmol/L (ref 98–110)
Creat: 0.78 mg/dL (ref 0.50–0.97)
Globulin: 2.7 g/dL (calc) (ref 1.9–3.7)
Glucose, Bld: 80 mg/dL (ref 65–99)
Potassium: 4 mmol/L (ref 3.5–5.3)
Sodium: 141 mmol/L (ref 135–146)
Total Bilirubin: 0.4 mg/dL (ref 0.2–1.2)
Total Protein: 6.8 g/dL (ref 6.1–8.1)
eGFR: 100 mL/min/{1.73_m2} (ref >=60)

## 2022-07-14 LAB — CBC
HCT: 35.7 % (ref 35.0–45.0)
Hemoglobin: 11.3 g/dL — ABNORMAL LOW (ref 11.7–15.5)
MCH: 30.1 pg (ref 27.0–33.0)
MCHC: 31.7 g/dL — ABNORMAL LOW (ref 32.0–36.0)
MCV: 94.9 fL (ref 80.0–100.0)
MPV: 11.9 fL (ref 7.5–12.5)
Platelets: 170 10*3/uL (ref 140–400)
RBC: 3.76 10*6/uL — ABNORMAL LOW (ref 3.80–5.10)
RDW: 13.9 % (ref 11.0–15.0)
WBC: 2.9 10*3/uL — ABNORMAL LOW (ref 3.8–10.8)

## 2022-07-14 LAB — LIPID PANEL
Cholesterol: 145 mg/dL (ref <200)
HDL: 45 mg/dL — ABNORMAL LOW (ref >=50)
LDL Cholesterol (Calc): 88 mg/dL (calc)
Non-HDL Cholesterol (Calc): 100 mg/dL (calc) (ref <130)
Total CHOL/HDL Ratio: 3.2 (calc) (ref <5.0)
Triglycerides: 42 mg/dL (ref <150)

## 2022-07-14 LAB — ANA: Anti Nuclear Antibody (ANA): NEGATIVE

## 2022-07-14 LAB — VITAMIN D 25 HYDROXY (VIT D DEFICIENCY, FRACTURES): Vit D, 25-Hydroxy: 42 ng/mL (ref 30–100)

## 2022-07-14 LAB — PROTIME-INR
INR: 1.1
Prothrombin Time: 11.1 s (ref 9.0–11.5)

## 2022-07-14 LAB — SEDIMENTATION RATE: Sed Rate: 6 mm/h (ref 0–20)

## 2022-07-14 LAB — TSH: TSH: 1.58 mIU/L

## 2022-10-06 ENCOUNTER — Encounter: Payer: Self-pay | Admitting: Internal Medicine

## 2023-01-10 ENCOUNTER — Other Ambulatory Visit: Payer: Self-pay | Admitting: Internal Medicine

## 2023-01-11 LAB — INFLUENZA A AND B AG, IMMUNOASSAY
INFLUENZA A ANTIGEN: NOT DETECTED
INFLUENZA B ANTIGEN: NOT DETECTED
MICRO NUMBER:: 14584952
SPECIMEN QUALITY:: ADEQUATE

## 2023-01-11 LAB — SARS-COV-2 RNA,(COVID-19) QUALITATIVE NAAT: SARS CoV2 RNA: NOT DETECTED

## 2023-07-07 ENCOUNTER — Other Ambulatory Visit: Payer: Self-pay | Admitting: Internal Medicine

## 2023-07-08 LAB — COMPLETE METABOLIC PANEL WITH GFR
AG Ratio: 1.5 (calc) (ref 1.0–2.5)
ALT: 14 U/L (ref 6–29)
AST: 16 U/L (ref 10–30)
Albumin: 4.3 g/dL (ref 3.6–5.1)
Alkaline phosphatase (APISO): 58 U/L (ref 31–125)
BUN: 18 mg/dL (ref 7–25)
CO2: 25 mmol/L (ref 20–32)
Calcium: 9.3 mg/dL (ref 8.6–10.2)
Chloride: 105 mmol/L (ref 98–110)
Creat: 0.79 mg/dL (ref 0.50–0.97)
Globulin: 2.9 g/dL (ref 1.9–3.7)
Glucose, Bld: 80 mg/dL (ref 65–99)
Potassium: 4.5 mmol/L (ref 3.5–5.3)
Sodium: 138 mmol/L (ref 135–146)
Total Bilirubin: 0.3 mg/dL (ref 0.2–1.2)
Total Protein: 7.2 g/dL (ref 6.1–8.1)
eGFR: 98 mL/min/{1.73_m2} (ref >=60)

## 2023-07-08 LAB — VITAMIN D 25 HYDROXY (VIT D DEFICIENCY, FRACTURES): Vit D, 25-Hydroxy: 42 ng/mL (ref 30–100)

## 2023-07-08 LAB — LIPID PANEL
Cholesterol: 162 mg/dL (ref <200)
HDL: 50 mg/dL (ref >=50)
LDL Cholesterol (Calc): 98 mg/dL
Non-HDL Cholesterol (Calc): 112 mg/dL (ref <130)
Total CHOL/HDL Ratio: 3.2 (calc) (ref <5.0)
Triglycerides: 46 mg/dL (ref <150)

## 2023-07-08 LAB — CBC
HCT: 33.1 % — ABNORMAL LOW (ref 35.0–45.0)
Hemoglobin: 10.9 g/dL — ABNORMAL LOW (ref 11.7–15.5)
MCH: 30.1 pg (ref 27.0–33.0)
MCHC: 32.9 g/dL (ref 32.0–36.0)
MCV: 91.4 fL (ref 80.0–100.0)
MPV: 12 fL (ref 7.5–12.5)
Platelets: 188 10*3/uL (ref 140–400)
RBC: 3.62 10*6/uL — ABNORMAL LOW (ref 3.80–5.10)
RDW: 12.7 % (ref 11.0–15.0)
WBC: 4 10*3/uL (ref 3.8–10.8)
